# Patient Record
Sex: Female | Born: 1960 | Race: White | Hispanic: No | Marital: Married | Smoking: Current every day smoker
Health system: Southern US, Community
[De-identification: ages and names within clinical notes are randomized; demographics above are authoritative.]

## PROBLEM LIST (undated history)

## (undated) DIAGNOSIS — Z862 Personal history of diseases of the blood and blood-forming organs and certain disorders involving the immune mechanism: Secondary | ICD-10-CM

## (undated) DIAGNOSIS — J45909 Unspecified asthma, uncomplicated: Secondary | ICD-10-CM

## (undated) DIAGNOSIS — K219 Gastro-esophageal reflux disease without esophagitis: Secondary | ICD-10-CM

## (undated) HISTORY — PX: OTHER SURGICAL HISTORY: SHX169

## (undated) HISTORY — DX: Unspecified asthma, uncomplicated: J45.909

## (undated) HISTORY — DX: Gastro-esophageal reflux disease without esophagitis: K21.9

## (undated) HISTORY — DX: Personal history of diseases of the blood and blood-forming organs and certain disorders involving the immune mechanism: Z86.2

## (undated) HISTORY — PX: DILATION AND CURETTAGE OF UTERUS: SHX78

---

## 1998-07-30 ENCOUNTER — Encounter: Admission: RE | Admit: 1998-07-30 | Discharge: 1998-07-30 | Payer: Self-pay | Admitting: *Deleted

## 2012-03-01 ENCOUNTER — Other Ambulatory Visit: Payer: Self-pay | Admitting: Allergy and Immunology

## 2012-03-01 ENCOUNTER — Ambulatory Visit
Admission: RE | Admit: 2012-03-01 | Discharge: 2012-03-01 | Disposition: A | Discharge status: Home / Self Care | Payer: BC Managed Care – PPO | Source: Ambulatory Visit | Attending: Allergy and Immunology | Admitting: Allergy and Immunology

## 2012-03-01 DIAGNOSIS — R05 Cough: Secondary | ICD-10-CM

## 2012-04-06 ENCOUNTER — Other Ambulatory Visit: Payer: Self-pay | Admitting: Nurse Practitioner

## 2012-04-06 DIAGNOSIS — R102 Pelvic and perineal pain: Secondary | ICD-10-CM

## 2012-04-07 ENCOUNTER — Ambulatory Visit
Admission: RE | Admit: 2012-04-07 | Discharge: 2012-04-07 | Disposition: A | Discharge status: Home / Self Care | Payer: 59 | Source: Ambulatory Visit | Attending: Nurse Practitioner | Admitting: Nurse Practitioner

## 2012-04-07 DIAGNOSIS — R102 Pelvic and perineal pain: Secondary | ICD-10-CM

## 2013-05-11 ENCOUNTER — Encounter: Payer: Self-pay | Admitting: Gynecology

## 2013-05-11 ENCOUNTER — Ambulatory Visit (INDEPENDENT_AMBULATORY_CARE_PROVIDER_SITE_OTHER): Payer: 59 | Admitting: Gynecology

## 2013-05-11 ENCOUNTER — Encounter: Payer: Self-pay | Admitting: Certified Nurse Midwife

## 2013-05-11 ENCOUNTER — Telehealth: Payer: Self-pay | Admitting: Obstetrics & Gynecology

## 2013-05-11 VITALS — BP 140/70 | Wt 136.0 lb

## 2013-05-11 DIAGNOSIS — N949 Unspecified condition associated with female genital organs and menstrual cycle: Secondary | ICD-10-CM

## 2013-05-11 DIAGNOSIS — N938 Other specified abnormal uterine and vaginal bleeding: Secondary | ICD-10-CM

## 2013-05-11 NOTE — Telephone Encounter (Signed)
Patient having excessive bleeding and wants to be seen as soon as possible.

## 2013-05-11 NOTE — Progress Notes (Signed)
Subjective:     Patient ID: Gabrielle Norton, female   DOB: [birth year], 52 y.o.   MRN: [id number]  HPI Comments: Pt presents with DUB, pt is currently on progesterone from [facility].  She is using 1 click a day, prior dose 1 click/ until day 14 and was supossed to 2 clicks until menses.  Then in February concentration increased to 5% and 3 clicks for 2nd halve of cycle but never did the increase.  Now pt reports May 1 began bleeding on day 23, very light for 3d, and has continue to bleed brown, red or pink, painless.  Pt has not entered menopause.  Started hormones approx 1y ago.  Now reports feeling hot in face    Review of Systems Per HPI    Objective:   Physical Exam  Constitutional: She is oriented to person, place, and time. She appears well-developed and well-nourished.  Neurological: She is alert and oriented to person, place, and time.  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  Pt was started on progestin for menorrhagia but may have too high or too low dose at this point.  Suggest pt stop progestins and rto for u/s to evaluate lining, she is agreeable   TSH, CBC, upt ordered

## 2013-05-11 NOTE — Telephone Encounter (Signed)
Spoke with pt about her irregular bleeding. Now on day 22 of bleeding, which has varied from spotting to heavy at times. Pt has history of low ferritin level and irregular bleeding since last year. Pt on several supplements. Pt also on compounded progesterone. Pt bleeding now, not excessively. Pt estimates she changes her pad once a day. Pt tired a lot and is weary of irregular bleeding. Pt prefers to be seen today if possible. Scheduled OV with TL today at 1:45.

## 2013-05-12 ENCOUNTER — Telehealth: Payer: Self-pay | Admitting: *Deleted

## 2013-05-12 LAB — CBC
MCH: 29.5 pg (ref 26.0–34.0)
MCHC: 32.7 g/dL (ref 30.0–36.0)
MCV: 90.1 fL (ref 78.0–100.0)
Platelets: 416 10*3/uL — ABNORMAL HIGH (ref 150–400)
RDW: 13.9 % (ref 11.5–15.5)

## 2013-05-12 NOTE — Telephone Encounter (Signed)
Spoke with pt who saw TL yesterday. Was told to stop compounded progesterone to see if it will help her bleeding. Pt has been on it for a year and is nervous to stop it altogether. Reports it has helped her heavy bleeding and clots, and she just wants Dr. Rondel Baton opinion before she will feel OK about DC'ing. Pt fears her ferritin level will drop, and she gets panic attacks and "feels crazy" when ferritin gets low. Pt fine with having PUS as suggested by TL. Please advise.

## 2013-05-12 NOTE — Telephone Encounter (Signed)
Pt would like a second opinion from nurse on instructions from last appointment.

## 2013-05-12 NOTE — Telephone Encounter (Signed)
Left Message To Call Back  

## 2013-05-12 NOTE — Telephone Encounter (Signed)
Message copied by Lorraine Lax on Fri May 12, 2013  1:16 PM ------      Message from: Douglass Rivers      Created: Fri May 12, 2013  8:21 AM       Thyroid, cbc normal, ------

## 2013-05-12 NOTE — Addendum Note (Signed)
Addended by: Lorraine Lax on: 05/12/2013 01:16 PM   Modules accepted: Orders

## 2013-05-15 NOTE — Telephone Encounter (Signed)
Can you bring me this pt's chart on Mon so I can look at it when I get out of the OR?  Thanks.

## 2013-05-16 NOTE — Telephone Encounter (Signed)
Pt notified of labs (see Labs)

## 2013-05-16 NOTE — Telephone Encounter (Signed)
Please see my prior note.  I didn't route it to you, accidentally.

## 2013-05-16 NOTE — Telephone Encounter (Signed)
Please let patient know I reviewed the notes from Dr. Liliana Cline visit and I think her suggestion is worth trying.  She also needs to come in for the ultrasound.

## 2013-05-17 NOTE — Telephone Encounter (Signed)
LMTCB  aa 

## 2013-05-17 NOTE — Telephone Encounter (Signed)
Doubtful because it is topical.

## 2013-05-17 NOTE — Telephone Encounter (Signed)
Spoke with pt about SM advice. Pt reports she hasn't used the progesterone since Sunday night, and "so far, so good." Relayed that SM said stopping it was worth trying to see if bleeding will stabilize.  Instructed pt that we will be contacting her soon about the PUS.  Pt still wondering if there could be any negative effects from "stopping cold [place]," and would like SM's opinion.

## 2013-05-17 NOTE — Telephone Encounter (Signed)
Spoke to pt about SM advice. Pt will continue to not use progesterone and see how it goes. Pt will call back with any problems.

## 2013-05-18 ENCOUNTER — Ambulatory Visit: Payer: Self-pay | Admitting: Obstetrics & Gynecology

## 2013-05-23 ENCOUNTER — Other Ambulatory Visit: Payer: Self-pay | Admitting: Gynecology

## 2013-05-23 ENCOUNTER — Ambulatory Visit (INDEPENDENT_AMBULATORY_CARE_PROVIDER_SITE_OTHER): Payer: 59 | Admitting: Gynecology

## 2013-05-23 ENCOUNTER — Ambulatory Visit (INDEPENDENT_AMBULATORY_CARE_PROVIDER_SITE_OTHER): Payer: 59

## 2013-05-23 DIAGNOSIS — N949 Unspecified condition associated with female genital organs and menstrual cycle: Secondary | ICD-10-CM

## 2013-05-23 DIAGNOSIS — N938 Other specified abnormal uterine and vaginal bleeding: Secondary | ICD-10-CM

## 2013-05-23 DIAGNOSIS — R9389 Abnormal findings on diagnostic imaging of other specified body structures: Secondary | ICD-10-CM

## 2013-05-23 DIAGNOSIS — N852 Hypertrophy of uterus: Secondary | ICD-10-CM

## 2013-05-23 DIAGNOSIS — N920 Excessive and frequent menstruation with regular cycle: Secondary | ICD-10-CM

## 2013-05-23 DIAGNOSIS — N831 Corpus luteum cyst of ovary, unspecified side: Secondary | ICD-10-CM

## 2013-05-23 DIAGNOSIS — Z7189 Other specified counseling: Secondary | ICD-10-CM

## 2013-05-23 DIAGNOSIS — Z79890 Hormone replacement therapy (postmenopausal): Secondary | ICD-10-CM

## 2013-05-23 NOTE — Progress Notes (Signed)
Pt presents for u/s secondary to DUB related to exogenous hormones, pt was instructed to stop progestin and since had what she described as a cycle. U/S images were reviewed with pt in real time, the EMS appears cystic/solid is 19.96, left ovary has a cystic/solid mass with +CFD within which may represent an early hemorrhagic cyst. We compared the u/s from 2013, EM 5-solid.  Based on difference we suggested a sonohysterogram, risks and benefits were reviewed and accepted.  SHG: speculum placed, cervix cleansed with betadine, insemination catheter was advanced with ease, the walls were gently distended- no gross defects were noted, EMS 15mm, appeared fluffy. Recommended proceeding with EMB: consented pipelle advanced to fundus, sounded 11cm, 2 passes for moderate tissue on 2nd pass. Sent to pathology Tolerated both procedures well, will contact with results Pt has many questions regarding her supplemental hormones that she has been taking.  We discussed ACOG's opinion on bioidentical and compounded hormones, we discussed the dose to dose variabiolity that can be seen, we discussed potential imbalances between her natural hormones and the supplements. I suggested that she might benefit with a consult after the biopsy results come back to review all of her options and she is agreeable. Length of additional time spent discussed hormones and DUB 78m

## 2013-05-26 ENCOUNTER — Telehealth: Payer: Self-pay | Admitting: Obstetrics & Gynecology

## 2013-05-26 NOTE — Telephone Encounter (Signed)
Patient's husband calling for patient's endo biopsy result since patient is so anxious. Biopsy was just done on 05-23-13.  Results noted released into EPIC at 1404 but have not been seen by a provider. Advised husband no results available.  Dr Tresa Res, can I release anything to patient?

## 2013-05-26 NOTE — Telephone Encounter (Signed)
Patient is calling requesting results of sonogram. Please discuss with husband Renae Fickle.

## 2013-05-26 NOTE — Telephone Encounter (Signed)
Please tell pt the biopsy was benign, and had a polyp.  She should have a consult with Dr. Farrel Gobble to discuss the plan from here.

## 2013-05-26 NOTE — Telephone Encounter (Signed)
Call back to [facility], (release in paper chart) notified as Dr Tresa Res instructed.  After Dr Farrel Gobble reviews on Monday we can schedule consult.

## 2013-05-31 ENCOUNTER — Telehealth: Payer: Self-pay | Admitting: *Deleted

## 2013-05-31 NOTE — Telephone Encounter (Signed)
Patient returned call and briefly discussed path report and that Dr Farrel Gobble will want to review treatement options.  Patient  Does NOT want synthetic hormones.  She asked about ablation and IUD, very brief explanation and advised this is what Dr Farrel Gobble needs to review with the benefits and risks of each.  Appt scheduled for tomm 06-01-13.

## 2013-05-31 NOTE — Telephone Encounter (Signed)
Message copied by Alisa Graff on Wed May 31, 2013  9:43 AM ------      Message from: Douglass Rivers      Created: Mon May 29, 2013  8:33 AM       Left message in [place] for pt to call for consult to discuss hormones and bleeding, should be last appt of day ------

## 2013-05-31 NOTE — Telephone Encounter (Signed)
LMTCB, VM confirms correct number.  LM calling to follow up on message from her doctor.  Notify of path report and needs OV to discuss.

## 2013-05-31 NOTE — Telephone Encounter (Signed)
Message copied by Alisa Graff on Wed May 31, 2013  9:25 AM ------      Message from: Douglass Rivers      Created: Mon May 29, 2013  8:33 AM       Left message in [place] for pt to call for consult to discuss hormones and bleeding, should be last appt of day ------

## 2013-06-01 ENCOUNTER — Ambulatory Visit (INDEPENDENT_AMBULATORY_CARE_PROVIDER_SITE_OTHER): Payer: 59 | Admitting: Gynecology

## 2013-06-01 VITALS — BP 138/66 | HR 84 | Resp 18 | Ht 65.5 in | Wt 131.0 lb

## 2013-06-01 DIAGNOSIS — N949 Unspecified condition associated with female genital organs and menstrual cycle: Secondary | ICD-10-CM

## 2013-06-01 DIAGNOSIS — N920 Excessive and frequent menstruation with regular cycle: Secondary | ICD-10-CM

## 2013-06-01 DIAGNOSIS — N938 Other specified abnormal uterine and vaginal bleeding: Secondary | ICD-10-CM

## 2013-06-01 NOTE — Progress Notes (Signed)
Pt here with husband to discuss follow up treatment for her dysfunctional bleeding. Pt reports that she had stopped the exogenous progestin during the heavy bleeding and had not restarted them.  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  We reviewed both her recent [facility] and the images of her prior one.  Endometrial polyp not well defined. We discussed several options with them- First, D&C hysteroscopy to remove the polyp and liniing, they are aware that the polyp can contribute to the bleeding and has a risk of becoming cancer.  She can elect for an ablation or placement of an IUD at the time of surgery both of which should control her bleeding into menopause.  We discussed the risks and benefits of both as well as the risk of surgery and the expected post-op course for both.  Bleeding, infection, uterine perforation was reviewed. She can watch her bleeding without the progestin and monitor forward how she does. We discussed the impact of dyssynchrony on BTB and the option of ACOG and FDA on compounded hormones. All questions were addressed. She was given information on Mirena IUD, Hysteroscopy and ablation. They were asked to call the office with their decision. Length 28m >50% face to face discussing menorrhagia and treatment

## 2013-06-01 NOTE — Patient Instructions (Addendum)
Endometrial Ablation Endometrial ablation removes the lining of the uterus (endometrium). It is usually a same day, outpatient treatment. Ablation helps avoid major surgery (such as a hysterectomy). A hysterectomy is removal of the cervix and uterus. Endometrial ablation has less risk and complications, has a shorter recovery period and is less expensive. After endometrial ablation, most women will have little or no menstrual bleeding. You may not keep your fertility. Pregnancy is no longer likely after this procedure but if you are pre-menopausal, you still need to use a reliable method of birth control following the procedure because pregnancy can occur. REASONS TO HAVE THE PROCEDURE MAY INCLUDE:  Heavy periods.  Bleeding that is causing anemia.  Anovulatory bleeding, very irregular, bleeding.  Bleeding submucous fibroids (on the lining inside the uterus) if they are smaller than 3 centimeters. REASONS NOT TO HAVE THE PROCEDURE MAY INCLUDE:  You wish to have more children.  You have a pre-cancerous or cancerous problem. The cause of any abnormal bleeding must be diagnosed before having the procedure.  You have pain coming from the uterus.  You have a submucus fibroid larger than 3 centimeters.  You recently had a baby.  You recently had an infection in the uterus.  You have a severe retro-flexed, tipped uterus and cannot insert the instrument to do the ablation.  You had a Cesarean section or deep major surgery on the uterus.  The inner cavity of the uterus is too large for the endometrial ablation instrument. RISKS AND COMPLICATIONS   Perforation of the uterus.  Bleeding.  Infection of the uterus, bladder or vagina.  Injury to surrounding organs.  Cutting the cervix.  An air bubble to the lung (air embolus).  Pregnancy following the procedure.  Failure of the procedure to help the problem requiring hysterectomy.  Decreased ability to diagnose cancer in the lining of  the uterus. BEFORE THE PROCEDURE  The lining of the uterus must be tested to make sure there is no pre-cancerous or cancer cells present.  Medications may be given to make the lining of the uterus thinner.  Ultrasound may be used to evaluate the size and look for abnormalities of the uterus.  Future pregnancy is not desired. PROCEDURE  There are different ways to destroy the lining of the uterus.   Resectoscope - radio frequency-alternating electric current is the most common one used.  Cryotherapy - freezing the lining of the uterus.  Heated Free Liquid - heated salt (saline) solution inserted into the uterus.  Microwave - uses high energy microwaves in the uterus.  Thermal Balloon - a catheter with a balloon tip is inserted into the uterus and filled with heated fluid. Your caregiver will talk with you about the method used in this clinic. They will also instruct you on the pros and cons of the procedure. Endometrial ablation is performed along with a procedure called operative hysteroscopy. A narrow viewing tube is inserted through the birth canal (vagina) and through the cervix into the uterus. A tiny camera attached to the viewing tube (hysteroscope) allows the uterine cavity to be shown on a TV monitor during surgery. Your uterus is filled with a harmless liquid to make the procedure easier. The lining of the uterus is then removed. The lining can also be removed with a resectoscope which allows your surgeon to cut away the lining of the uterus under direct vision. Usually, you will be able to go home within an hour after the procedure. HOME CARE INSTRUCTIONS   Do   not drive for 24 hours.  No tampons, douching or intercourse for 2 weeks or until your caregiver approves.  Rest at home for 24 to 48 hours. You may then resume normal activities unless told differently by your caregiver.  Take your temperature two times a day for 4 days, and record it.  Take any medications your  caregiver has ordered, as directed.  Use some form of contraception if you are pre-menopausal and do not want to get pregnant. Bleeding after the procedure is normal. It varies from light spotting and mildly watery to bloody discharge for 4 to 6 weeks. You may also have mild cramping. Only take over-the-counter or prescription medicines for pain, discomfort, or fever as directed by your caregiver. Do not use aspirin, as this may aggravate bleeding. Frequent urination during the first 24 hours is normal. You will not know how effective your surgery is until at least 3 months after the surgery. SEEK IMMEDIATE MEDICAL CARE IF:   Bleeding is heavier than a normal menstrual cycle.  An oral temperature above 102 F (38.9 C) develops.  You have increasing cramps or pains not relieved with medication or develop belly (abdominal) pain which does not seem to be related to the same area of earlier cramping and pain.  You are light headed, weak or have fainting episodes.  You develop pain in the shoulder strap areas.  You have chest or leg pain.  You have abnormal vaginal discharge.  You have painful urination. Document Released: 10/16/2004 Document Revised: 02/29/2012 Document Reviewed: 01/14/2008 [facility] Patient Information 2014 [place], [place]. Hysteroscopy Hysteroscopy is a procedure used for looking inside the womb (uterus). It may be done for many different reasons, including:  To evaluate abnormal bleeding, fibroid (benign, noncancerous) tumors, polyps, scar tissue (adhesions), and possibly cancer of the uterus.  To look for lumps (tumors) and other uterine growths.  To look for causes of why a woman cannot get pregnant (infertility), causes of recurrent loss of pregnancy (miscarriages), or a lost intrauterine device (IUD).  To perform a sterilization by blocking the fallopian tubes from inside the uterus. A hysteroscopy should be done right after a menstrual period to be sure you are  not pregnant. LET YOUR CAREGIVER KNOW ABOUT:   Allergies.  Medicines taken, including herbs, eyedrops, over-the-counter medicines, and creams.  Use of steroids (by mouth or creams).  Previous problems with anesthetics or numbing medicines.  History of bleeding or blood problems.  History of blood clots.  Possibility of pregnancy, if this applies.  Previous surgery.  Other health problems. RISKS AND COMPLICATIONS   Putting a hole in the uterus.  Excessive bleeding.  Infection.  Damage to the cervix.  Injury to other organs.  Allergic reaction to medicines.  Too much fluid used in the uterus for the procedure. BEFORE THE PROCEDURE   Do not take aspirin or blood thinners for a week before the procedure, or as directed. It can cause bleeding.  Arrive at least 60 minutes before the procedure or as directed to read and sign the necessary forms.  Arrange for someone to take you home after the procedure.  If you smoke, do not smoke for 2 weeks before the procedure. PROCEDURE   Your caregiver may give you medicine to relax you. He or she may also give you a medicine that numbs the area around the cervix (local anesthetic) or a medicine that makes you sleep (general anesthesia).  Sometimes, a medicine is placed in the cervix the day before the  procedure. This medicine makes the cervix have a larger opening (dilate). This makes it easier for the instrument to be inserted into the uterus.  A small instrument (hysteroscope) is inserted through the vagina into the uterus. This instrument is similar to a pencil-sized telescope with a light.  During the procedure, air or a liquid is put into the uterus, which allows the surgeon to see better.  Sometimes, tissue is gently scraped from inside the uterus. These tissue samples are sent to a specialist who looks at tissue samples (pathologist). The pathologist will give a report to your caregiver. This will help your caregiver decide  if further treatment is necessary. The report will also help your caregiver decide on the best treatment if the test comes back abnormal. AFTER THE PROCEDURE   If you had a general anesthetic, you may be groggy for a couple hours after the procedure.  If you had a local anesthetic, you will be advised to rest at the surgical center or caregiver's office until you are stable and feel ready to go home.  You may have some cramping for a couple days.  You may have bleeding, which varies from light spotting for a few days to menstrual-like bleeding for up to 3 to 7 days. This is normal.  Have someone take you home. FINDING OUT THE RESULTS OF YOUR TEST Not all test results are available during your visit. If your test results are not back during the visit, make an appointment with your caregiver to find out the results. Do not assume everything is normal if you have not heard from your caregiver or the medical facility. It is important for you to follow up on all of your test results. HOME CARE INSTRUCTIONS   Do not drive for 24 hours or as instructed.  Only take over-the-counter or prescription medicines for pain, discomfort, or fever as directed by your caregiver.  Do not take aspirin. It can cause or aggravate bleeding.  Do not drive or drink alcohol while taking pain medicine.  You may resume your usual diet.  Do not use tampons, douche, or have sexual intercourse for 2 weeks, or as advised by your caregiver.  Rest and sleep for the first 24 to 48 hours.  Take your temperature twice a day for 4 to 5 days. Write it down. Give these temperatures to your caregiver if they are abnormal (above 98.6 F or 37.0 C).  Take medicines your caregiver has ordered as directed.  Follow your caregiver's advice regarding diet, exercise, lifting, driving, and general activities.  Take showers instead of baths for 2 weeks, or as recommended by your caregiver.  If you develop constipation:  Take a  mild laxative with the advice of your caregiver.  Eat bran foods.  Drink enough water and fluids to keep your urine clear or pale yellow.  Try to have someone with you or available to you for the first 24 to 48 hours, especially if you had a general anesthetic.  Make sure you and your family understand everything about your operation and recovery.  Follow your caregiver's advice regarding follow-up appointments and Pap smears. SEEK MEDICAL CARE IF:   You feel dizzy or lightheaded.  You feel sick to your stomach (nauseous).  You develop abnormal vaginal discharge.  You develop a rash.  You have an abnormal reaction or allergy to your medicine.  You need stronger pain medicine. SEEK IMMEDIATE MEDICAL CARE IF:   Bleeding is heavier than a normal menstrual period  or you have blood clots.  You have an oral temperature above 102 F (38.9 C), not controlled by medicine.  You have increasing cramps or pains not relieved with medicine.  You develop belly (abdominal) pain that does not seem to be related to the same area of earlier cramping and pain.  You pass out.  You develop pain in the tops of your shoulders (shoulder strap areas).  You develop shortness of breath. MAKE SURE YOU:   Understand these instructions.  Will watch your condition.  Will get help right away if you are not doing well or get worse. Document Released: 03/15/2001 Document Revised: 02/29/2012 Document Reviewed: 07/08/2009 [facility] Patient Information 2014 [place], [place]. Intrauterine Device Information An intrauterine device (IUD) is inserted into your uterus and prevents pregnancy. There are 2 types of IUDs available:  Copper IUD. This type of IUD is wrapped in copper wire and is placed inside the uterus. Copper makes the uterus and fallopian tubes produce a fluid that kills sperm. The copper IUD can stay in place for 10 years.  Hormone IUD. This type of IUD contains the hormone progestin  (synthetic progesterone). The hormone thickens the cervical mucus and prevents sperm from entering the uterus, and it also thins the uterine lining to prevent implantation of a fertilized egg. The hormone can weaken or kill the sperm that get into the uterus. The hormone IUD can stay in place for 5 years. Your caregiver will make sure you are a good candidate for a contraceptive IUD. Discuss with your caregiver the possible side effects. ADVANTAGES  It is highly effective, reversible, long-acting, and low maintenance.  There are no estrogen-related side effects.  An IUD can be used when breastfeeding.  It is not associated with weight gain.  It works immediately after insertion.  The copper IUD does not interfere with your female hormones.  The progesterone IUD can make heavy menstrual periods lighter.  The progesterone IUD can be used for 5 years.  The copper IUD can be used for 10 years. DISADVANTAGES  The progesterone IUD can be associated with irregular bleeding patterns.  The copper IUD can make your menstrual flow heavier and more painful.  You may experience cramping and vaginal bleeding after insertion. Document Released: 11/10/2004 Document Revised: 02/29/2012 Document Reviewed: 04/11/2011 [facility] Patient Information 2014 [place], [place].

## 2013-06-06 ENCOUNTER — Encounter: Payer: Self-pay | Admitting: Gynecology

## 2013-06-06 ENCOUNTER — Ambulatory Visit (INDEPENDENT_AMBULATORY_CARE_PROVIDER_SITE_OTHER): Payer: 59 | Admitting: Gynecology

## 2013-06-06 VITALS — BP 120/78 | Ht 66.0 in | Wt 128.0 lb

## 2013-06-06 DIAGNOSIS — N926 Irregular menstruation, unspecified: Secondary | ICD-10-CM

## 2013-06-06 NOTE — Patient Instructions (Signed)
Office will call you with lab results. Followup for sonohysterogram as scheduled.

## 2013-06-06 NOTE — Progress Notes (Signed)
Gabrielle Norton [birth year] [id number]        52 y.o.  [id number] for a new patient with a history of heavier menses this past year getting closer together coming every 21-25 days. Had been started on progesterone cream daily a year ago. Saw Dr. Farrel Gobble in followup when she started to have more erratic bleeding with intermenstrual bleeding. Had a normal CBC and TSH. A negative hCG. Sonohysterogram questionable polyp although not clearly defined on review of the pictures. Endometrial sample showed dyssynchronous endometrium. Patient wanted second opinion. Currently finishing a menstrual cycle lasting for the last 5-7 days. Using coitus interruptus for contraception. Currently smokes daily.   Past medical history,surgical history, medications, allergies, family history and social history were all reviewed and documented in the EPIC chart.  ROS:  Performed and pertinent positives and negatives are included in the history, assessment and plan .  Exam: Kim assistant Filed Vitals:   06/06/13 1001  BP: 120/78  Height: 5\' 6"  (1.676 m)  Weight: 128 lb (58.06 kg)   General appearance  Normal Abdominal  soft, nontender, without masses, organomegaly or hernia Pelvic  Ext/BUS/vagina  normal with menses flow.  Cervix  normal with multiparous changes  Uterus  anteverted, normal size, shape and contour, midline and mobile nontender   Adnexa  Without masses or tenderness    Anus and perineum  normal       Assessment/Plan:  52 y.o. [id number] female    1. Irregular bleeding. Menses have been heavier over the past year had been using progesterone cream daily and has discontinued this. Menses also getting closer together every 21-25 days. With some intermenstrual bleeding. Sonohysterogram showed some question of polyp although not clearly demonstrated. Endometrial sample showed dyssynchronous endometrium. Options for management were reviewed to include observation, hysteroscopy D&C to rule out polyp, repeat  sonohysterogram. I think her more frequent menses are perimenopausal/age related. The heavier menses seem to be going on longer. Options for management to include hormonal manipulation, Mirena IUD, endometrial ablation discussed. Certainly before proceeding with endometrial ablation I would want to rule out the polyp with repeat sonohysterogram. The issues of bioidentical hormone use and less well proven benefits discussed. The patient has already read about this and made up her mind to stop it. She will followup for a repeat sonohysterogram I also wanted to recheck a CBC and an FSH to see where we stand from a perimenopausal standpoint.  Addendum: Subsequently received pathology report from endometrial biopsy done by Dr. Farrel Gobble which showed: ENDOMETRIUM, BIOPSY: -DYSSYNCHRONOUS ENDOMETRIUM AND PORTIONS OF ENDOMETRIAL POLYP -NEGATIVE FOR ATYPIA OR CARCINOMA Will further discuss with patient at followup ultrasound appointment.     Dara Lords MD, 12:11 PM 06/06/2013

## 2013-06-08 ENCOUNTER — Ambulatory Visit: Payer: 59 | Admitting: Obstetrics & Gynecology

## 2013-06-09 ENCOUNTER — Other Ambulatory Visit: Payer: Self-pay | Admitting: Gynecology

## 2013-06-09 DIAGNOSIS — N949 Unspecified condition associated with female genital organs and menstrual cycle: Secondary | ICD-10-CM

## 2013-06-12 ENCOUNTER — Ambulatory Visit (INDEPENDENT_AMBULATORY_CARE_PROVIDER_SITE_OTHER): Payer: 59

## 2013-06-12 ENCOUNTER — Encounter: Payer: Self-pay | Admitting: Gynecology

## 2013-06-12 ENCOUNTER — Ambulatory Visit (INDEPENDENT_AMBULATORY_CARE_PROVIDER_SITE_OTHER): Payer: 59 | Admitting: Gynecology

## 2013-06-12 DIAGNOSIS — N839 Noninflammatory disorder of ovary, fallopian tube and broad ligament, unspecified: Secondary | ICD-10-CM

## 2013-06-12 DIAGNOSIS — N840 Polyp of corpus uteri: Secondary | ICD-10-CM

## 2013-06-12 DIAGNOSIS — N949 Unspecified condition associated with female genital organs and menstrual cycle: Secondary | ICD-10-CM

## 2013-06-12 DIAGNOSIS — N926 Irregular menstruation, unspecified: Secondary | ICD-10-CM

## 2013-06-12 DIAGNOSIS — N938 Other specified abnormal uterine and vaginal bleeding: Secondary | ICD-10-CM

## 2013-06-12 NOTE — Patient Instructions (Signed)
Office will call you with the biopsy results. We will then decide course of action.

## 2013-06-12 NOTE — Progress Notes (Signed)
Patient presents for a sonohysterogram with history as outlined 06/06/2013 noted.  Ultrasound shows uterus overall normal to generous in size. Homogeneous echotexture. Endometrial echo 13.5 mm. Right and left ovaries grossly normal.  Procedure: Sonohysterogram performed, sterile technique, easy catheter introduction, good distention with no abnormalities.  Assessment and plan: Irregular more frequent menses. Recent hemoglobin 13.5, FSH 21 TSH normal. Feel most of her bleeding is age related. Biopsy by Dr. Farrel Gobble did show desynchronous endometrium with fragments of benign endometrial polyp. No evidence of endometrial polyp on sonohysterogram today. Patient will follow up her biopsy results. Various scenarios to include observation, hormonal manipulation, hysteroscopy, endometrial ablation, Mirena IUD were reviewed. Patient is leaning towards Mirena IUD. Certainly would address both contraception and hopefully menstrual regulation. Patient will follow up for biopsy results and then we'll go from there.

## 2013-06-14 ENCOUNTER — Other Ambulatory Visit: Payer: Self-pay | Admitting: Gynecology

## 2013-06-14 ENCOUNTER — Telehealth: Payer: Self-pay | Admitting: Gynecology

## 2013-06-14 DIAGNOSIS — Z3049 Encounter for surveillance of other contraceptives: Secondary | ICD-10-CM

## 2013-06-14 MED ORDER — LEVONORGESTREL 20 MCG/24HR IU IUD: INTRAUTERINE_SYSTEM | Freq: Once | INTRAUTERINE | Status: AC

## 2013-06-14 NOTE — Telephone Encounter (Signed)
06/14/13-Pt was advised today that her [employer] ins covers the Mirena at 100%,no copay. She is coming in 06/15/13 for insertion/wl

## 2013-06-15 ENCOUNTER — Encounter: Payer: Self-pay | Admitting: Gynecology

## 2013-06-15 ENCOUNTER — Ambulatory Visit (INDEPENDENT_AMBULATORY_CARE_PROVIDER_SITE_OTHER): Payer: 59 | Admitting: Gynecology

## 2013-06-15 DIAGNOSIS — Z30431 Encounter for routine checking of intrauterine contraceptive device: Secondary | ICD-10-CM

## 2013-06-15 NOTE — Patient Instructions (Signed)
Intrauterine Device Insertion Most often, an intrauterine device (IUD) is inserted into the uterus to prevent pregnancy. There are 2 types of IUDs available:  Copper IUD. This type of IUD creates an environment that is not favorable to sperm survival. The mechanism of action of the copper IUD is not known for certain. It can stay in place for 10 years.  Hormone IUD. This type of IUD contains the hormone progestin (synthetic progesterone). The progestin thickens the cervical mucus and prevents sperm from entering the uterus, and it also thins the uterine lining. There is no evidence that the hormone IUD prevents implantation. The hormone IUD can stay in place for up to 5 years. An IUD is the most cost-effective birth control if left in place for the full duration. It may be removed at any time. LET YOUR CAREGIVER KNOW ABOUT:  Sensitivity to metals.  Medicines taken including herbs, eyedrops, over-the-counter medicines, and creams.  Use of steroids (by mouth or creams).  Previous problems with anesthetics or numbing medicine.  Previous gynecological surgery.  History of blood clots or clotting disorders.  Possibility of pregnancy.  Menstrual irregularities.  Concerns regarding unusual vaginal discharge or odors.  Previous experience with an IUD.  Other health problems. RISKS AND COMPLICATIONS  Accidental puncture (perforation) of the uterus.  Accidental placement of the IUD either in the muscle layer of the uterus (myometrium) or outside the uterus. If this happen, the IUD can be found essentially floating around the bowels. When this happens, the IUD must be taken out surgically.  The IUD may fall out of the uterus (expulsion). This is more common in women who have recently had a child.   Pregnancy in the fallopian tube (ectopic). BEFORE THE PROCEDURE  Schedule the IUD insertion for when you will have your menstrual period or right after, to make sure you are not pregnant.  Placement of the IUD is better tolerated shortly after a menstrual cycle.  You may need to take tests or be examined to make sure you are not pregnant.  You may be required to take a pregnancy test.  You may be required to get checked for sexually transmitted infections (STIs) prior to placement. Placing an IUD in someone who has an infection can make an infection worse.  You may be given a pain reliever to take 1 or 2 hours before the procedure.  An exam will be performed to determine the size and position of your uterus.  Ask your caregiver about changing or stopping your regular medicines. PROCEDURE   A tool (speculum) is placed in the vagina. This allows your caregiver to see the lower part of the uterus (cervix).  The cervix is prepped with a medicine that lowers the risk of infection.  You may be given a medicine to numb each side of the cervix (intracervical or paracervical block). This is used to block and control any discomfort with insertion.  A tool (uterine sound) is inserted into the uterus to determine the length of the uterine cavity and the direction the uterus may be tilted.  A slim instrument (IUD inserter) is inserted through the cervical canal and into your uterus.  The IUD is placed in the uterine cavity and the insertion device is removed.  The nylon string that is attached to the IUD, and used for eventual IUD removal, is trimmed. It is trimmed so that it lays high in the vagina, just outside the cervix. AFTER THE PROCEDURE  You may have bleeding after the   procedure. This is normal. It varies from light spotting for a few days to menstrual-like bleeding.  You may have mild cramping.  Practice checking the string coming out of the cervix to make sure the IUD remains in the uterus. If you cannot feel the string, you should schedule a "string check" with your caregiver.  If you had a hormone IUD inserted, expect that your period may be lighter or nonexistent  within a year's time (though this is not always the case). There may be delayed fertility with the hormone IUD as a result of its progesterone effect. When you are ready to become pregnant, it is suggested to have the IUD removed up to 1 year in advance.  Yearly exams are advised. Document Released: 08/05/2011 Document Revised: 02/29/2012 Document Reviewed: 08/05/2011 [employer] Patient Information 2014 [employer].  

## 2013-06-15 NOTE — Progress Notes (Signed)
Patient presents for Mirena IUD placement. She has read through the booklet, has no contraindications and signed the consent form.  I reviewed the insertional process with her as well as the risks to include infection either immediate or long-term, uterine perforation or migration requiring surgery to remove, other complications such as pain, hormonal side effects and possibilities of failure with subsequent pregnancy.   Exam with Kim assistant Pelvic: External BUS vagina normal. Cervix normal . Uterus anteverted normal size shape contour midline mobile nontender. Adnexa without masses or tenderness.  Procedure: The cervix was cleansed with Betadine, anterior lip grasped with a single-tooth tenaculum, the uterus was sounded and a Mirena IUD was placed according to manufacturer's recommendations without difficulty. The strings were trimmed. The patient tolerated well and will follow up in one month for a postinsertional check.  Lot number:  [id number]

## 2013-06-16 ENCOUNTER — Telehealth: Payer: Self-pay | Admitting: *Deleted

## 2013-06-16 NOTE — Telephone Encounter (Signed)
Pt has IUD placement yesterday in office, called today asking if normal to have some cramping. I informed pt this is normal and she may experience spotting as well. Pt okay with this and will call if further questions.

## 2013-06-30 ENCOUNTER — Other Ambulatory Visit: Payer: 59

## 2013-06-30 ENCOUNTER — Ambulatory Visit: Payer: 59 | Admitting: Gynecology

## 2013-07-10 ENCOUNTER — Ambulatory Visit (INDEPENDENT_AMBULATORY_CARE_PROVIDER_SITE_OTHER): Payer: 59 | Admitting: Gynecology

## 2013-07-10 ENCOUNTER — Encounter: Payer: Self-pay | Admitting: Gynecology

## 2013-07-10 DIAGNOSIS — Z30431 Encounter for routine checking of intrauterine contraceptive device: Secondary | ICD-10-CM

## 2013-07-10 DIAGNOSIS — N926 Irregular menstruation, unspecified: Secondary | ICD-10-CM

## 2013-07-10 NOTE — Progress Notes (Signed)
Patient presents for followup IUD check. Had Mirena IUD placed one month ago and has done well although has spotting on and off since. No pain or other issues. Thinks that she had a period in the interim.  Exam was Gabrielle Norton Abdomen soft nontender without masses guarding rebound organomegaly. Pelvic external BUS vagina normal. Cervix normal. IUD string visualized an appropriate length. Uterus normal size midline mobile nontender. Adnexa without masses or tenderness.  Assessment and plan: Normal IUD check. Monitor spotting. As long as it resolves  we'll follow expectantly. If continues she will call. Followup when she is due for her annual exam.

## 2013-07-10 NOTE — Patient Instructions (Signed)
Followup when you're due for your annual exam. Sooner if any issues.

## 2013-09-29 ENCOUNTER — Encounter: Payer: 59 | Admitting: Gynecology

## 2013-10-07 IMAGING — CR DG CHEST 2V
2 series · 2 of 2 positions shown · non-contrast
Comparison: None.

CLINICAL DATA: Cough, congestion, fever

CHEST - 2 VIEW

[w chest pa]
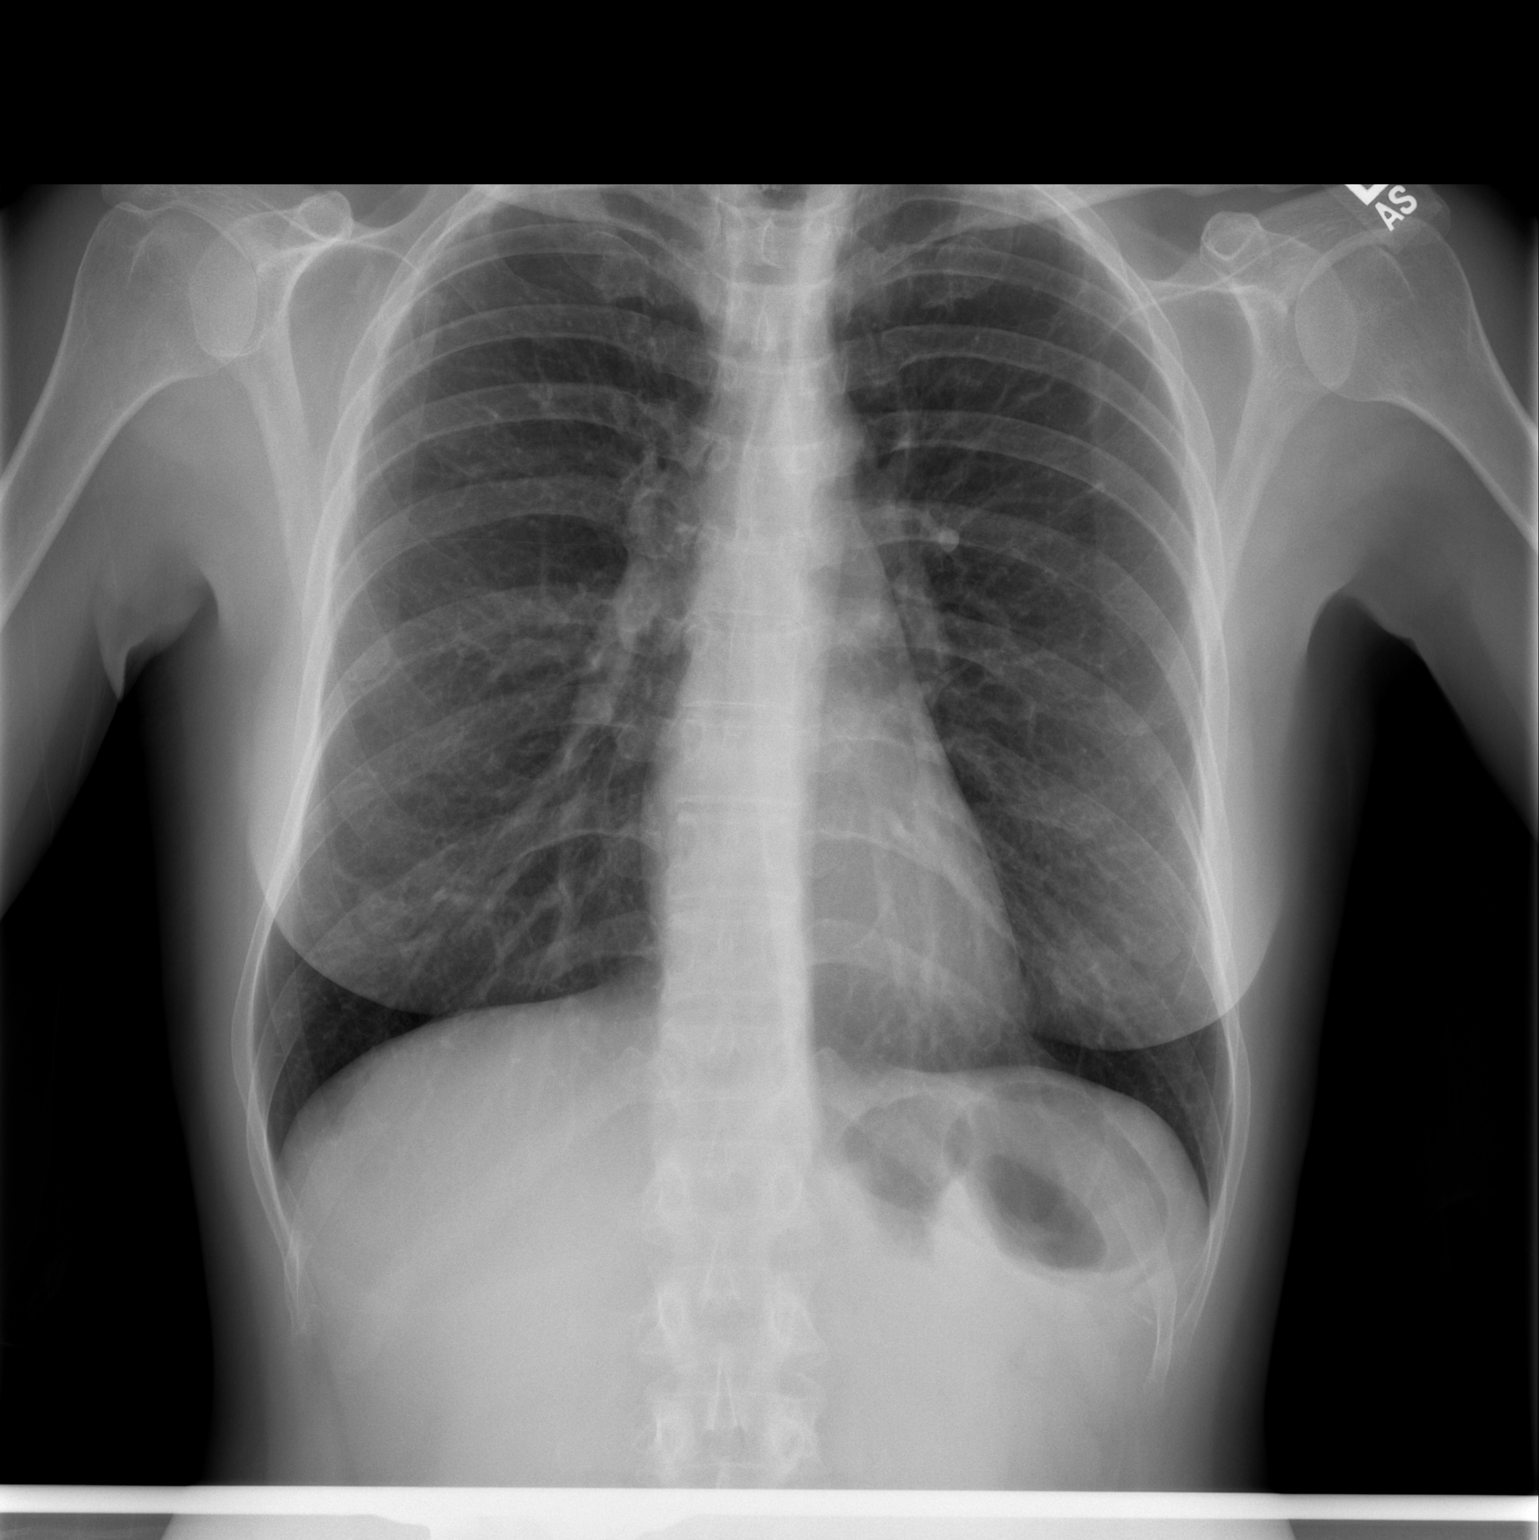

[w chest lat]
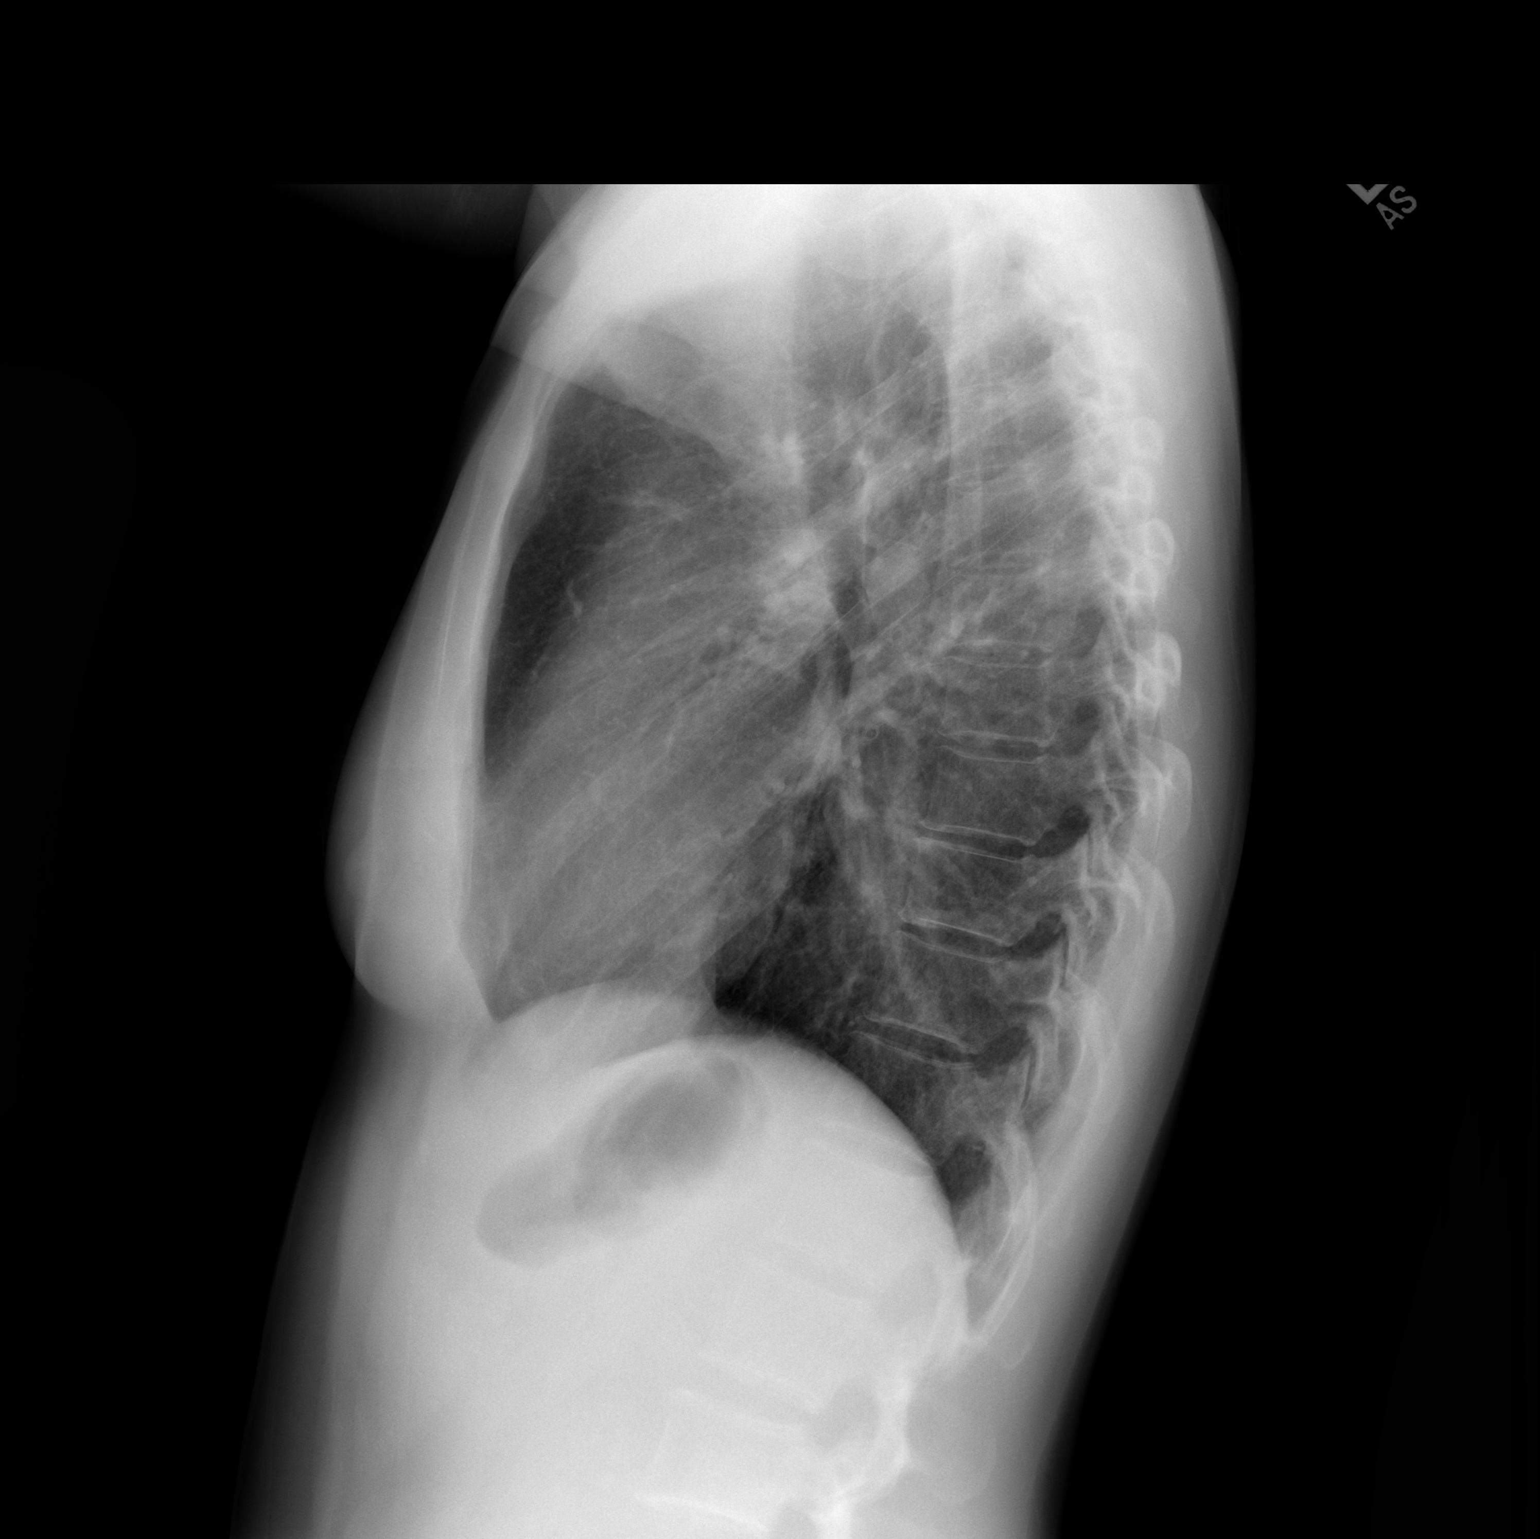

[2 of 2 positions shown; findings below may reference images not displayed]

FINDINGS: Normal heart size and vascularity.  Symmetric lower chest
nipple shadows noted.  Negative for pneumonia, collapse,
consolidation, edema, effusion or pneumothorax.  Trachea midline.
IMPRESSION: No acute chest finding.

## 2013-10-26 ENCOUNTER — Other Ambulatory Visit: Payer: Self-pay

## 2014-01-05 ENCOUNTER — Ambulatory Visit (INDEPENDENT_AMBULATORY_CARE_PROVIDER_SITE_OTHER): Payer: 59 | Admitting: Family Medicine

## 2014-01-05 VITALS — BP 160/100 | HR 115 | Temp 99.1°F | Resp 18 | Ht 66.0 in | Wt 130.0 lb

## 2014-01-05 DIAGNOSIS — J3489 Other specified disorders of nose and nasal sinuses: Secondary | ICD-10-CM

## 2014-01-05 DIAGNOSIS — R059 Cough, unspecified: Secondary | ICD-10-CM

## 2014-01-05 DIAGNOSIS — H1010 Acute atopic conjunctivitis, unspecified eye: Secondary | ICD-10-CM

## 2014-01-05 DIAGNOSIS — R05 Cough: Secondary | ICD-10-CM

## 2014-01-05 DIAGNOSIS — J09X2 Influenza due to identified novel influenza A virus with other respiratory manifestations: Secondary | ICD-10-CM

## 2014-01-05 LAB — POCT RAPID STREP A (OFFICE): Rapid Strep A Screen: NEGATIVE

## 2014-01-05 LAB — POCT INFLUENZA A/B
INFLUENZA B, POC: NEGATIVE
Influenza A, POC: POSITIVE

## 2014-01-05 LAB — POCT CBC
GRANULOCYTE PERCENT: 82.4 % — AB (ref 37–80)
HEMATOCRIT: 50.6 % — AB (ref 37.7–47.9)
Hemoglobin: 15.9 g/dL (ref 12.2–16.2)
LYMPH, POC: 1.2 (ref 0.6–3.4)
MCH: 30.9 pg (ref 27–31.2)
MCHC: 31.4 g/dL — AB (ref 31.8–35.4)
MCV: 98.5 fL — AB (ref 80–97)
MID (cbc): 0.8 (ref 0–0.9)
MPV: 7.8 fL (ref 0–99.8)
POC Granulocyte: 9.4 — AB (ref 2–6.9)
POC LYMPH %: 10.6 % (ref 10–50)
POC MID %: 7 %M (ref 0–12)
Platelet Count, POC: 294 10*3/uL (ref 142–424)
RBC: 5.14 M/uL (ref 4.04–5.48)
RDW, POC: 13.6 %
WBC: 11.4 10*3/uL — AB (ref 4.6–10.2)

## 2014-01-05 MED ORDER — OLOPATADINE HCL 0.2 % OP SOLN: OPHTHALMIC | Status: DC

## 2014-01-05 MED ORDER — OSELTAMIVIR PHOSPHATE 75 MG PO CAPS: 75.0000 mg | ORAL_CAPSULE | Freq: Two times a day (BID) | ORAL | Status: DC

## 2014-01-05 MED ORDER — IPRATROPIUM BROMIDE 0.03 % NA SOLN: 2.0000 | Freq: Four times a day (QID) | NASAL | Status: DC

## 2014-01-05 MED ORDER — HYDROCODONE-HOMATROPINE 5-1.5 MG/5ML PO SYRP: 5.0000 mL | ORAL_SOLUTION | Freq: Three times a day (TID) | ORAL | Status: DC | PRN

## 2014-01-05 NOTE — Patient Instructions (Signed)
You do have the flu- this is likely why you are feeling so bad.  Rest and drink plenty of fluids.  You can use the cough syrup as needed; remember it can make you drowsy so do not use it when you need to drive.  Let me know if you are not feeling better in the next few days- Sooner if worse.   We would like your pulse to be less than 100 and your blood pressure less than 135/85; if this is not the case please let me know.

## 2014-01-05 NOTE — Progress Notes (Signed)
[facility] [address], [place] [id number] [phone]  Date:  01/05/2014   Name:  Gabrielle Norton   DOB:  05-14-1961   MRN:  [id number]  PCP:  No PCP Per Patient    Chief Complaint: Sinus Problem   History of Present Illness:  Gabrielle Norton is a 53 y.o. very pleasant female patient who presents with the following:  Here today to follow-up illness. She started to feel ill just before Christmas. She went to another UC 10 days ago and was given Augmentin and a 5 day course of prednisone for presumed sinus infection. She felt that she was getting better- however she then took a turn for the worse again about 5 days ago.  She is just finishing up the augmentin rx.  She did not notice any fever at home, but has not checked her temp No GI symptoms Minimal cough Her main concern is that she feels bad in general- she has body aches, fatigue, and no energy.  She also notes that her nose is draining a lot down the back of her throat and really bothering her.   She has an IUD due to menorrhagia  Patient Active Problem List   Diagnosis Date Noted  . DUB (dysfunctional uterine bleeding) 05/24/2013  . Menorrhagia 05/24/2013  . Anemia     Past Medical History  Diagnosis Date  . Acid reflux disease with ulcer   . History of anemia   . Asthma     Past Surgical History  Procedure Laterality Date  . Dilation and curettage of uterus    . Mirena      Inserted 06-15-13    History  Substance Use Topics  . Smoking status: Current Every Day Smoker -- 0.50 packs/day    Types: Cigarettes  . Smokeless tobacco: Not on file  . Alcohol Use: 1.0 oz/week    2 drink(s) per week    Family History  Problem Relation Age of Onset  . Depression Father   . Diabetes Father     Allergies  Allergen Reactions  . Erythromycin Other (See Comments)    Cramping   . Xylocaine [Lidocaine Hcl] Rash    Medication list has been reviewed and updated.  Current Outpatient  Prescriptions on File Prior to Visit  Medication Sig Dispense Refill  . Ascorbic Acid (VITAMIN C PO) Take by mouth.      . beclomethasone (QVAR) 80 MCG/ACT inhaler Inhale 2 puffs into the lungs 2 (two) times daily.       . Cetirizine HCl (ZYRTEC PO) Take by mouth.      . IRON PO Take by mouth.      . levalbuterol (XOPENEX HFA) 45 MCG/ACT inhaler Inhale 1-2 puffs into the lungs every 4 (four) hours as needed for wheezing.      . Cholecalciferol (VITAMIN D PO) Take by mouth.      . Olopatadine HCl (PATADAY OP) Apply to eye.       Current Facility-Administered Medications on File Prior to Visit  Medication Dose Route Frequency Provider Last Rate Last Dose  . levonorgestrel (MIRENA) 20 MCG/24HR IUD   Intrauterine Once Dara Lordsimothy P Fontaine, MD        Review of Systems:  As per HPI- otherwise negative.   Physical Examination: Filed Vitals:   01/05/14 1337  BP: 160/90  Pulse: 115  Temp: 99.1 F (37.3 C)  Resp: 18   Filed Vitals:   01/05/14 1337  Height:  5\' 6"  (1.676 m)  Weight: 130 lb (58.968 kg)   Body mass index is 20.99 kg/(m^2). Ideal Body Weight: Weight in (lb) to have BMI = 25: 154.6  GEN: WDWN, NAD, Non-toxic, A & O x 3, slim build, looks well HEENT: Atraumatic, Normocephalic. Neck supple. No masses, small but tender cervical nodes.  Bilateral TM wnl, oropharynx normal.  PEERL,EOMI.   Ears and Nose: No external deformity. CV: RRR, No M/G/R. No JVD. No thrill. No extra heart sounds. PULM: CTA B, no wheezes, crackles, rhonchi. No retractions. No resp. distress. No accessory muscle use. ABD: S, NT, ND. No rebound. No HSM. EXTR: No c/c/e NEURO Normal gait.  PSYCH: Normally interactive. Conversant. Not depressed or anxious appearing. Admits that she feels very anxious and that her pulse tends to go up at MD office   Results for orders placed in visit on 01/05/14  POCT CBC      Result Value Range   WBC 11.4 (*) 4.6 - 10.2 K/uL   Lymph, poc 1.2  0.6 - 3.4   POC LYMPH  PERCENT 10.6  10 - 50 %L   MID (cbc) 0.8  0 - 0.9   POC MID % 7.0  0 - 12 %M   POC Granulocyte 9.4 (*) 2 - 6.9   Granulocyte percent 82.4 (*) 37 - 80 %G   RBC 5.14  4.04 - 5.48 M/uL   Hemoglobin 15.9  12.2 - 16.2 g/dL   HCT, POC 16.1 (*) 09.6 - 47.9 %   MCV 98.5 (*) 80 - 97 fL   MCH, POC 30.9  27 - 31.2 pg   MCHC 31.4 (*) 31.8 - 35.4 g/dL   RDW, POC 04.5     Platelet Count, POC 294  142 - 424 K/uL   MPV 7.8  0 - 99.8 fL  POCT INFLUENZA A/B      Result Value Range   Influenza A, POC Positive     Influenza B, POC Negative    POCT RAPID STREP A (OFFICE)      Result Value Range   Rapid Strep A Screen Negative  Negative    Assessment and Plan: Sinus pain - Plan: POCT CBC, POCT Influenza A/B, POCT rapid strep A  Cough - Plan: HYDROcodone-homatropine (HYCODAN) 5-1.5 MG/5ML syrup  Allergic conjunctivitis - Plan: Olopatadine HCl (PATADAY) 0.2 % SOLN  Influenza due to identified novel influenza A virus with other respiratory manifestations - Plan: oseltamivir (TAMIFLU) 75 MG capsule, ipratropium (ATROVENT) 0.03 % nasal spray  Gabrielle Norton does have the flu today.  Explained this is likely why she started to feel worse.  She will finish out her augmentin.  She would like to have an rx for tamiflu.  Understands she is probably out of the tamiflu window but as we are not sure exactly when she got sick she might like to try it anyway.  Hycodan as needed for cough May try atrovent NS for PND Discussed her tachycardia and HTN.  She endorses anxiety and feels this is shy her HR is up She will check her BP and HR at home and let me know if any abnormality persists.    Signed Abbe Amsterdam, MD

## 2014-01-08 ENCOUNTER — Encounter: Payer: Self-pay | Admitting: Family Medicine

## 2014-01-09 NOTE — Telephone Encounter (Signed)
Called and spoke with her.  Overall she is a bit better, does not think she is running a fever.  Explained that her current sx are likely due to flu, and that another abx so shortly after she finished her augmentin is likely not a good idea.  She will keep me posted on her condition, and will let me know if not improving in the next 1 or 2 days Her eye sx are now resolved

## 2014-04-04 ENCOUNTER — Encounter: Payer: Self-pay | Admitting: Gynecology

## 2014-05-07 ENCOUNTER — Encounter: Payer: Self-pay | Admitting: Obstetrics & Gynecology

## 2014-05-31 ENCOUNTER — Ambulatory Visit (INDEPENDENT_AMBULATORY_CARE_PROVIDER_SITE_OTHER): Payer: 59 | Admitting: Family Medicine

## 2014-05-31 VITALS — BP 150/88 | HR 135 | Temp 98.5°F | Resp 20 | Ht 65.75 in | Wt 136.0 lb

## 2014-05-31 DIAGNOSIS — F4320 Adjustment disorder, unspecified: Secondary | ICD-10-CM

## 2014-05-31 DIAGNOSIS — F410 Panic disorder [episodic paroxysmal anxiety] without agoraphobia: Secondary | ICD-10-CM

## 2014-05-31 DIAGNOSIS — J329 Chronic sinusitis, unspecified: Secondary | ICD-10-CM

## 2014-05-31 DIAGNOSIS — IMO0001 Reserved for inherently not codable concepts without codable children: Secondary | ICD-10-CM

## 2014-05-31 DIAGNOSIS — H1010 Acute atopic conjunctivitis, unspecified eye: Secondary | ICD-10-CM

## 2014-05-31 DIAGNOSIS — N951 Menopausal and female climacteric states: Secondary | ICD-10-CM

## 2014-05-31 DIAGNOSIS — H1045 Other chronic allergic conjunctivitis: Secondary | ICD-10-CM

## 2014-05-31 DIAGNOSIS — R030 Elevated blood-pressure reading, without diagnosis of hypertension: Secondary | ICD-10-CM

## 2014-05-31 LAB — POCT CBC
GRANULOCYTE PERCENT: 86.9 % — AB (ref 37–80)
HCT, POC: 46.7 % (ref 37.7–47.9)
HEMOGLOBIN: 14.9 g/dL (ref 12.2–16.2)
Lymph, poc: 1 (ref 0.6–3.4)
MCH: 30.6 pg (ref 27–31.2)
MCHC: 31.9 g/dL (ref 31.8–35.4)
MCV: 95.8 fL (ref 80–97)
MID (cbc): 0.4 (ref 0–0.9)
MPV: 7.9 fL (ref 0–99.8)
POC Granulocyte: 9.4 — AB (ref 2–6.9)
POC LYMPH PERCENT: 9.1 %L — AB (ref 10–50)
POC MID %: 4 % (ref 0–12)
Platelet Count, POC: 374 10*3/uL (ref 142–424)
RBC: 4.87 M/uL (ref 4.04–5.48)
RDW, POC: 13.6 %
WBC: 10.8 10*3/uL — AB (ref 4.6–10.2)

## 2014-05-31 MED ORDER — AMOXICILLIN-POT CLAVULANATE 875-125 MG PO TABS: 1.0000 | ORAL_TABLET | Freq: Two times a day (BID) | ORAL | Status: DC

## 2014-05-31 MED ORDER — OLOPATADINE HCL 0.2 % OP SOLN: OPHTHALMIC | Status: DC

## 2014-05-31 MED ORDER — HYDROXYZINE HCL 25 MG PO TABS: 12.5000 mg | ORAL_TABLET | Freq: Four times a day (QID) | ORAL | Status: DC | PRN

## 2014-05-31 MED ORDER — BACITRACIN-POLYMYXIN B 500-10000 UNIT/GM OP OINT: 1.0000 "application " | TOPICAL_OINTMENT | Freq: Four times a day (QID) | OPHTHALMIC | Status: DC

## 2014-05-31 MED ORDER — TRAZODONE HCL 50 MG PO TABS: 25.0000 mg | ORAL_TABLET | Freq: Every evening | ORAL | Status: DC | PRN

## 2014-05-31 MED ORDER — METHYLPREDNISOLONE 4 MG PO KIT: PACK | ORAL | Status: DC

## 2014-05-31 NOTE — Patient Instructions (Addendum)
[facility]  [address], [place], [place] [address]  [phone]  [facility]  Address: [address], [place], [place] [address]  [phone]  [facility] Address: [address], [place], [place] [address]  [phone]  [facility] [address] [place], [place] Phone: [phone]  Panic Attacks Panic attacks are sudden, short-livedsurges of severe anxiety, fear, or discomfort. They may occur for no reason when you are relaxed, when you are anxious, or when you are sleeping. Panic attacks may occur for a number of reasons:   Healthy people occasionally have panic attacks in extreme, life-threatening situations, such as war or natural disasters. Normal anxiety is a protective mechanism of the body that helps us react to danger (fight or flight response).  Panic attacks are often seen with anxiety disorders, such as panic disorder, social anxiety disorder, generalized anxiety disorder, and phobias. Anxiety disorders cause excessive or uncontrollable anxiety. They may interfere with your relationships or other life activities.  Panic attacks are sometimes seen with other mental illnesses such as depression and posttraumatic stress disorder.  Certain medical conditions, prescription medicines, and drugs of abuse can cause panic attacks. SYMPTOMS  Panic attacks start suddenly, peak within 20 minutes, and are accompanied by four or more of the following symptoms:  Pounding heart or fast heart rate (palpitations).  Sweating.  Trembling or shaking.  Shortness of breath or feeling smothered.  Feeling choked.  Chest pain or discomfort.  Nausea or strange feeling in your stomach.  Dizziness, lightheadedness, or feeling like you will faint.  Chills or hot flushes.  Numbness or tingling in your lips or hands and feet.  Feeling that things are  not real or feeling that you are not yourself.  Fear of losing control or going crazy.  Fear of dying. Some of these symptoms can mimic serious medical conditions. For example, you may think you are having a heart attack. Although panic attacks can be very scary, they are not life threatening. DIAGNOSIS  Panic attacks are diagnosed through an assessment by your health care provider. Your health care provider will ask questions about your symptoms, such as where and when they occurred. Your health care provider will also ask about your medical history and use of alcohol and drugs, including prescription medicines. Your health care provider may order blood tests or other studies to rule out a serious medical condition. Your health care provider may refer you to a mental health professional for further evaluation. TREATMENT   Most healthy people who have one or two panic attacks in an extreme, life-threatening situation will not require treatment.  The treatment for panic attacks associated with anxiety disorders or other mental illness typically involves counseling with a mental health professional, medicine, or a combination of both. Your health care provider will help determine what treatment is best for you.  Panic attacks due to physical illness usually goes away with treatment of the illness. If prescription medicine is causing panic attacks, talk with your health care provider about stopping the medicine, decreasing the dose, or substituting another medicine.  Panic attacks due to alcohol or drug abuse goes away with abstinence. Some adults need professional help in order to stop drinking or using drugs. HOME CARE INSTRUCTIONS   Take all your medicines as prescribed.   Check with your health care provider before starting new prescription or over-the-counter medicines.  Keep all follow up appointments with your health care provider.  SEEK MEDICAL CARE IF:  You are not able to take your  medicines as prescribed.  Your symptoms do not improve or get worse. SEEK IMMEDIATE MEDICAL CARE IF:   You experience panic attack symptoms that are different than your usual symptoms.  You have serious thoughts about hurting yourself or others.  You are taking medicine for panic attacks and have a serious side effect. MAKE SURE YOU:  Understand these instructions.  Will watch your condition.  Will get help right away if you are not doing well or get worse. Document Released: 12/07/2005 Document Revised: 09/27/2013 Document Reviewed: 07/21/2013 [facility] Patient Information 2014 [place], [place]. Perimenopause Perimenopause is the time when your body begins to move into the menopause (no menstrual period for 12 straight months). It is a natural process. Perimenopause can begin 2 8 years before the menopause and usually lasts for 1 year after the menopause. During this time, your ovaries may or may not produce an egg. The ovaries vary in their production of estrogen and progesterone hormones each month. This can cause irregular menstrual periods, difficulty getting pregnant, vaginal bleeding between periods, and uncomfortable symptoms. CAUSES  Irregular production of the ovarian hormones, estrogen and progesterone, and not ovulating every month.  Other causes include:  Tumor of the pituitary gland in the brain.  Medical disease that affects the ovaries.  Radiation treatment.  Chemotherapy.  Unknown causes.  Heavy smoking and excessive alcohol intake can bring on perimenopause sooner. SIGNS AND SYMPTOMS   Hot flashes.  Night sweats.  Irregular menstrual periods.  Decreased sex drive.  Vaginal dryness.  Headaches.  Mood swings.  Depression.  Memory problems.  Irritability.  Tiredness.  Weight gain.  Trouble getting pregnant.  The beginning of losing bone cells (osteoporosis).  The beginning of hardening of the arteries (atherosclerosis). DIAGNOSIS   Your health care provider will make a diagnosis by analyzing your age, menstrual history, and symptoms. He or she will do a physical exam and note any changes in your body, especially your female organs. Female hormone tests may or may not be helpful depending on the amount of female hormones you produce and when you produce them. However, other hormone tests may be helpful to rule out other problems. TREATMENT  In some cases, no treatment is needed. The decision on whether treatment is necessary during the perimenopause should be made by you and your health care provider based on how the symptoms are affecting you and your lifestyle. Various treatments are available, such as:  Treating individual symptoms with a specific medicine for that symptom.  Herbal medicines that can help specific symptoms.  Counseling.  Group therapy. HOME CARE INSTRUCTIONS   Keep track of your menstrual periods (when they occur, how heavy they are, how long between periods, and how long they last) as well as your symptoms and when they started.  Only take over-the-counter or prescription medicines as directed by your health care provider.  Sleep and rest.  Exercise.  Eat a diet that contains calcium (good for your bones) and soy (acts like the estrogen hormone).  Do not smoke.  Avoid alcoholic beverages.  Take vitamin supplements as recommended by your health care provider. Taking vitamin E may help in certain cases.  Take calcium and vitamin D supplements to help prevent bone loss.  Group therapy is sometimes helpful.  Acupuncture may help in some cases. SEEK MEDICAL CARE IF:   You have questions about any symptoms you are having.  You need a referral to  a specialist (gynecologist, psychiatrist, or psychologist). SEEK IMMEDIATE MEDICAL CARE IF:   You have vaginal bleeding.  Your period lasts longer than 8 days.  Your periods are recurring sooner than 21 days.  You have bleeding after  intercourse.  You have severe depression.  You have pain when you urinate.  You have severe headaches.  You have vision problems. Document Released: 01/14/2005 Document Revised: 09/27/2013 Document Reviewed: 07/06/2013 [facility] Patient Information 2014 [place], [place]. Menopause and Herbal Products Menopause is the normal time of life when menstrual periods stop completely. Menopause is complete when you have missed 12 consecutive menstrual periods. It usually occurs between the ages of 14 to 67, with an average age of 4. Very rarely does a woman develop menopause before 51 years old. At menopause, your ovaries stop producing the female hormones, estrogen and progesterone. This can cause undesirable symptoms and also affect your health. Sometimes the symptoms can occur 4 to 5 years before the menopause begins. There is no relationship between menopause and:  Oral contraceptives.  Number of children you had.  Race.  The age your menstrual periods started (menarche). Heavy smokers and very thin women may develop menopause earlier in life. Estrogen and progesterone hormone treatment is the usual method of treating menopausal symptoms. However, there are women who should not take hormone treatment. This is true of:   Women that have breast or uterine cancer.  Women who prefer not to take hormones because of certain side effects (abnormal uterine bleeding).  Women who are afraid that hormones may cause breast cancer.  Women who have a history of liver disease, heart disease, stroke, or blood clots. For these women, there are other medications that may help treat their menopausal symptoms. These medications are found in plants and botanical products. They can be found in the form of herbs, teas, oils, tinctures, and pills.  CAUSES:  The ovaries stop producing the female hormones estrogen and progesterone.  Other causes include:  Surgery to remove both ovaries.  The ovaries stop  functioning for no know reason.  Tumors of the pituitary gland in the brain.  Medical disease that affects the ovaries and hormone production.  Radiation treatment to the abdomen or pelvis.  Chemotherapy that affects the ovaries. PHYTOESTROGENS: Phytoestrogens occur naturally in plants and plant products. They act like estrogen in the body. Herbal medications are made from these plants and botanical steroids. There are 3 types of phytoestrogens:  Isoflavones (genistein and daidzein) are found in soy, garbanzo beans, miso and tofu foods.  Ligins are found in the shell of seeds. They are used to make oils like flaxseed oil. The bacteria in your intestine act on these foods to produce the estrogen-like hormones.  Coumestans are estrogen-like. Some of the foods they are found in include sunflower seeds and bean sprouts. CONDITIONS AND THEIR POSSIBLE HERBAL TREATMENT:  Hot flashes and night sweats.  Soy, black cohosh and evening primrose.  Irritability, insomnia, depression and memory problems.  Chasteberry, ginseng, and soy.  St. John's wort may be helpful for depression. However, there is a concern of it causing cataracts of the eye and may have bad effects on other medications. St. John's wort should not be taken for long time and without your caregiver's advice.  Loss of libido and vaginal and skin dryness.  Wild yam and soy.  Prevention of coronary heart disease and osteoporosis.  Soy and Isoflavones. Several studies have shown that some women benefit from herbal medications, but most of the studies  have not consistently shown that these supplements are much better than placebo. Other forms of treatment to help women with menopausal symptoms include a balanced diet, rest, exercise, vitamin and calcium (with vitamin D) supplements, acupuncture, and group therapy when necessary. THOSE WHO SHOULD NOT TAKE HERBAL MEDICATIONS INCLUDE:  Women who are planning on getting pregnant  unless told by your caregiver.  Women who are breastfeeding unless told by your caregiver.  Women who are taking other prescription medications unless told by your caregiver.  Infants, children, and elderly women unless told by your caregiver. Different herbal medications have different and unmeasured amounts of the herbal ingredients. There are no regulations, quality control, and standardization of the ingredients in herbal medications. Therefore, the amount of the ingredient in the medication may vary from one herb, pill, tea, oil or tincture to another. Many herbal medications can cause serious problems and can even have poisonous effects if taken too much or too long. If problems develop, the medication should be stopped and recorded by your caregiver. HOME CARE INSTRUCTIONS  Do not take or give children herbal medications without your caregiver's advice.  Let your caregiver know all the medications you are taking. This includes prescription, over-the-counter, eye drops, and creams.  Do not take herbal medications longer or more than recommended.  Tell your caregiver about any side effects from the medication. SEEK MEDICAL CARE IF:  You develop a fever of 102 F (38.9 C), or as directed by your caregiver.  You feel sick to your stomach (nauseous), vomit, or have diarrhea.  You develop a rash.  You develop abdominal pain.  You develop severe headaches.  You start to have vision problems.  You feel dizzy or faint.  You start to feel numbness in any part of your body.  You start shaking (have convulsions). Document Released: 05/25/2008 Document Revised: 11/23/2012 Document Reviewed: 12/23/2010 [facility] Patient Information 2014 [place], [place].

## 2014-05-31 NOTE — Progress Notes (Signed)
Subjective:    Patient ID: Gabrielle Norton, female    DOB: February 18, 1961, 53 y.o.   MRN: [id number] Chief Complaint  Patient presents with  . Sinus Problem    pt states "feeling sick for past 3 weeks"--color mucus  . Eye Pain    swollen since yesterday  . Sore Throat    HPI  Felt better when she was at the beach last week. Has not needed xopenex since last week On zyrtec for severe seasonal allergies, netti pot twice a day, cough last week but taht resolved.   Hot/cold started last night, lots of sinus pain, and now right eye is swelling and spreading to left eye. Was matted together last night.   Left ear pain, sore throat.  Nervous, not sleeping, panic attacks, hot flashes.  Past Medical History  Diagnosis Date  . Acid reflux disease with ulcer   . History of anemia   . Asthma    Current Outpatient Prescriptions on File Prior to Visit  Medication Sig Dispense Refill  . Cetirizine HCl (ZYRTEC PO) Take by mouth.      . levalbuterol (XOPENEX HFA) 45 MCG/ACT inhaler Inhale 1-2 puffs into the lungs every 4 (four) hours as needed for wheezing.       Current Facility-Administered Medications on File Prior to Visit  Medication Dose Route Frequency Provider Last Rate Last Dose  . levonorgestrel (MIRENA) 20 MCG/24HR IUD   Intrauterine Once Dara Lords, MD       Allergies  Allergen Reactions  . Erythromycin Other (See Comments)    Cramping   . Xylocaine [Lidocaine Hcl] Rash     Review of Systems  Constitutional: Positive for diaphoresis and fatigue. Negative for fever.  HENT: Positive for congestion, postnasal drip, rhinorrhea, sinus pressure, sneezing and sore throat. Negative for ear pain, facial swelling, tinnitus and trouble swallowing.   Eyes: Negative for photophobia and visual disturbance.  Respiratory: Positive for cough and wheezing.   Cardiovascular: Negative for chest pain and palpitations.  Endocrine: Positive for heat intolerance. Negative for cold  intolerance.  Psychiatric/Behavioral: Positive for sleep disturbance and agitation. Negative for suicidal ideas, self-injury and dysphoric mood. The patient is nervous/anxious.       BP 150/88  Pulse 135  Temp(Src) 98.5 F (36.9 C) (Oral)  Resp 20  Ht 5' 5.75" (1.67 m)  Wt 136 lb (61.689 kg)  BMI 22.12 kg/m2  SpO2 98%  LMP 05/24/2014 Objective:   Physical Exam  Constitutional: She is oriented to person, place, and time. She appears well-developed and well-nourished. She appears lethargic. She appears ill. No distress.  HENT:  Head: Normocephalic and atraumatic.  Right Ear: External ear and ear canal normal. Tympanic membrane is retracted. A middle ear effusion is present.  Left Ear: External ear and ear canal normal. Tympanic membrane is retracted. A middle ear effusion is present.  Nose: Mucosal edema and rhinorrhea present. Right sinus exhibits maxillary sinus tenderness. Left sinus exhibits maxillary sinus tenderness.  Mouth/Throat: Uvula is midline and mucous membranes are normal. Posterior oropharyngeal erythema present. No oropharyngeal exudate, posterior oropharyngeal edema or tonsillar abscesses.  Eyes: Conjunctivae are normal. Right eye exhibits no discharge. Left eye exhibits no discharge. No scleral icterus.  Neck: Normal range of motion. Neck supple.  Cardiovascular: Normal rate, regular rhythm, normal heart sounds and intact distal pulses.   Pulmonary/Chest: Effort normal and breath sounds normal.  Lymphadenopathy:       Head (right side): Submandibular adenopathy present. No preauricular and no  posterior auricular adenopathy present.       Head (left side): Submandibular adenopathy present. No preauricular and no posterior auricular adenopathy present.    She has no cervical adenopathy.       Right: No supraclavicular adenopathy present.       Left: No supraclavicular adenopathy present.  Neurological: She is oriented to person, place, and time. She appears lethargic.    Skin: Skin is warm and dry. She is not diaphoretic. No erythema.  Psychiatric: She has a normal mood and affect. Her behavior is normal.          Results for orders placed in visit on 05/31/14  POCT CBC      Result Value Ref Range   WBC 10.8 (*) 4.6 - 10.2 K/uL   Lymph, poc 1.0  0.6 - 3.4   POC LYMPH PERCENT 9.1 (*) 10 - 50 %L   MID (cbc) 0.4  0 - 0.9   POC MID % 4.0  0 - 12 %M   POC Granulocyte 9.4 (*) 2 - 6.9   Granulocyte percent 86.9 (*) 37 - 80 %G   RBC 4.87  4.04 - 5.48 M/uL   Hemoglobin 14.9  12.2 - 16.2 g/dL   HCT, POC 16.146.7  09.637.7 - 47.9 %   MCV 95.8  80 - 97 fL   MCH, POC 30.6  27 - 31.2 pg   MCHC 31.9  31.8 - 35.4 g/dL   RDW, POC 04.513.6     Platelet Count, POC 374  142 - 424 K/uL   MPV 7.9  0 - 99.8 fL     Assessment & Plan:   Elevated blood pressure - Plan: POCT CBC, Ferritin  Sinusitis - Plan: POCT CBC, Ferritin  Allergic conjunctivitis - Plan: Ferritin, Olopatadine HCl (PATADAY) 0.2 % SOLN  Perimenopausal symptoms - Plan: Ferritin  Panic anxiety syndrome - Plan: Ferritin  Adjustment disorder - Plan: Ferritin  Meds ordered this encounter  Medications  . Olopatadine HCl (PATADAY) 0.2 % SOLN    Sig: 1 drop in each eye daily    Dispense:  2.5 mL    Refill:  6  . hydrOXYzine (ATARAX/VISTARIL) 25 MG tablet    Sig: Take 0.5-1 tablets (12.5-25 mg total) by mouth every 6 (six) hours as needed for anxiety.    Dispense:  30 tablet    Refill:  1  . traZODone (DESYREL) 50 MG tablet    Sig: Take 0.5-1 tablets (25-50 mg total) by mouth at bedtime as needed for sleep.    Dispense:  30 tablet    Refill:  1  . bacitracin-polymyxin b (POLYSPORIN) ophthalmic ointment    Sig: Place 1 application into both eyes 4 (four) times daily.    Dispense:  3.5 g    Refill:  0  . amoxicillin-clavulanate (AUGMENTIN) 875-125 MG per tablet    Sig: Take 1 tablet by mouth 2 (two) times daily.    Dispense:  20 tablet    Refill:  0  . methylPREDNISolone (MEDROL, PAK,) 4 MG tablet     Sig: follow package directions    Dispense:  21 tablet    Refill:  0    Norberto SorensonEva Mihir Flanigan, MD MPH

## 2014-06-01 ENCOUNTER — Telehealth: Payer: Self-pay

## 2014-06-01 LAB — FERRITIN: Ferritin: 24 ng/mL (ref 10–291)

## 2014-06-01 MED ORDER — GENTAMICIN SULFATE 0.3 % OP SOLN: 2.0000 [drp] | OPHTHALMIC | Status: DC

## 2014-06-01 NOTE — Telephone Encounter (Signed)
New rx sent in

## 2014-06-01 NOTE — Telephone Encounter (Signed)
Pt called and reported that she does not like using the ointment for her eyes. It is making her eye mat together and she is afraid to use it in her good eye as Rxd, but stated that her good eye is beginning to have Sxs also and she knows she needs something for both eyes. Pt requests a new Rx be sent in for a liquid drop instead of the polysporin ointment. Dr Clelia CroftShaw, can you Rx a drop for her? She states she has used the ointment twice today and would like something that she can go ahead and use today in both eyes.

## 2014-06-02 MED ORDER — AZELASTINE HCL 0.05 % OP SOLN: 1.0000 [drp] | Freq: Two times a day (BID) | OPHTHALMIC | Status: DC

## 2014-06-02 NOTE — Telephone Encounter (Signed)
Unsure what other ointment pt was referring to - she did not like polysporin ointment so changed to gent drops.  Pataday to expensive - cannot get a PA until she has failed others that are on her insurance list but we have no way of knowing what her insurance covers - she will need to call her insurance for this or perhaps her pharmacist can help. There are lots over other options for allergy eye drops - for example Zaditor which is available otc, or patanol or optivar which are rx just to name a few. For now - try optivar instead of pataday. New rx sent to pharmacy.

## 2014-06-02 NOTE — Telephone Encounter (Signed)
Not using the allergy eye drops because of insurance purposes (pataday).   Is there anything we can do to aid in a prior authorization for this medication? She is aware of her new med being called in, she also did not p/u the other medication because it was an ointment. I instructed her not to pick that up as she has a new medication to p/u.

## 2014-06-02 NOTE — Addendum Note (Signed)
Addended by: Norberto SorensonSHAW, EVA on: 06/02/2014 10:02 AM   Modules accepted: Orders, Medications

## 2014-06-06 ENCOUNTER — Telehealth: Payer: Self-pay

## 2014-06-06 NOTE — Telephone Encounter (Signed)
Please review. Thanks!  

## 2014-06-06 NOTE — Telephone Encounter (Signed)
PT HAVE QUESTIONS REGARDING HER LAB RESULTS PLEASE CALL [phone]

## 2014-06-08 ENCOUNTER — Encounter: Payer: Self-pay | Admitting: Family Medicine

## 2014-06-29 ENCOUNTER — Other Ambulatory Visit: Payer: Self-pay | Admitting: Family Medicine

## 2014-10-17 ENCOUNTER — Ambulatory Visit (INDEPENDENT_AMBULATORY_CARE_PROVIDER_SITE_OTHER): Payer: 59 | Admitting: Women's Health

## 2014-10-17 ENCOUNTER — Ambulatory Visit: Payer: 59 | Admitting: Women's Health

## 2014-10-17 ENCOUNTER — Encounter: Payer: Self-pay | Admitting: Women's Health

## 2014-10-17 VITALS — BP 180/96 | Ht 65.0 in | Wt 129.0 lb

## 2014-10-17 DIAGNOSIS — N898 Other specified noninflammatory disorders of vagina: Secondary | ICD-10-CM

## 2014-10-17 DIAGNOSIS — D509 Iron deficiency anemia, unspecified: Secondary | ICD-10-CM

## 2014-10-17 DIAGNOSIS — F411 Generalized anxiety disorder: Secondary | ICD-10-CM

## 2014-10-17 LAB — WET PREP FOR TRICH, YEAST, CLUE
CLUE CELLS WET PREP: NONE SEEN
Trich, Wet Prep: NONE SEEN
Yeast Wet Prep HPF POC: NONE SEEN

## 2014-10-17 LAB — FERRITIN: Ferritin: 40 ng/mL (ref 10–291)

## 2014-10-17 LAB — TSH: TSH: 1.699 u[IU]/mL (ref 0.350–4.500)

## 2014-10-17 MED ORDER — ALPRAZOLAM 0.25 MG PO TABS: 0.2500 mg | ORAL_TABLET | Freq: Every evening | ORAL | Status: AC | PRN

## 2014-10-17 NOTE — Progress Notes (Signed)
Patient ID: Gabrielle BucyAudrienne M Norton, female   DOB: 02-07-1961, 53 y.o.   MRN: [id number] Presents with persistent spotting and anxiety. Has had spotting most days, denies heavy bleeding. Mirena IUD placed 05/2013. Smoker. Denies urinary symptoms, vaginal discharge, abdominal pain or fever. Reports feeling shaky, nervous, hot flushes, night sweats, and generally overwhelmed. Denies suicidal ideation.  Denies hypertension or elevated blood pressure history. Questions if hormonal imbalance causing anxiety. 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 Accompanied by husband. History of low ferritin. 3 children all doing well, youngest 6311. Husband dialysis, failed transplant.  Exam: Hyperventilating, appears very anxious. Blood pressure 176/90 with retake. Abdomen soft, nontender, external genitalia within normal limits, speculum exam scant menses type blood present, IUD strings visible. Wet prep negative. Bimanual no CMT or adnexal fullness or tenderness.  Panic attack/anxiety Spotting with Mirena IUD  Plan: Left message with Berniece AndreasJulie Whitt therapist to schedule counseling session ASAP, Xanax 0.25, 1/2 tablet initially, retake blood pressure at home and if continues elevated refer to primary care to manage. Reviewed need for counseling and probable daily medication to manage anxiety. States does not like to take any medication. Ferritin, TSH, FSH.

## 2014-10-17 NOTE — Patient Instructions (Signed)
Generalized Anxiety Disorder Generalized anxiety disorder (GAD) is a mental disorder. It interferes with life functions, including relationships, work, and school. GAD is different from normal anxiety, which everyone experiences at some point in their lives in response to specific life events and activities. Normal anxiety actually helps us prepare for and get through these life events and activities. Normal anxiety goes away after the event or activity is over.  GAD causes anxiety that is not necessarily related to specific events or activities. It also causes excess anxiety in proportion to specific events or activities. The anxiety associated with GAD is also difficult to control. GAD can vary from mild to severe. People with severe GAD can have intense waves of anxiety with physical symptoms (panic attacks).  SYMPTOMS The anxiety and worry associated with GAD are difficult to control. This anxiety and worry are related to many life events and activities and also occur more days than not for 6 months or longer. People with GAD also have three or more of the following symptoms (one or more in children):  Restlessness.   Fatigue.  Difficulty concentrating.   Irritability.  Muscle tension.  Difficulty sleeping or unsatisfying sleep. DIAGNOSIS GAD is diagnosed through an assessment by your health care provider. Your health care provider will ask you questions aboutyour mood,physical symptoms, and events in your life. Your health care provider may ask you about your medical history and use of alcohol or drugs, including prescription medicines. Your health care provider may also do a physical exam and blood tests. Certain medical conditions and the use of certain substances can cause symptoms similar to those associated with GAD. Your health care provider may refer you to a mental health specialist for further evaluation. TREATMENT The following therapies are usually used to treat GAD:    Medication. Antidepressant medication usually is prescribed for long-term daily control. Antianxiety medicines may be added in severe cases, especially when panic attacks occur.   Talk therapy (psychotherapy). Certain types of talk therapy can be helpful in treating GAD by providing support, education, and guidance. A form of talk therapy called cognitive behavioral therapy can teach you healthy ways to think about and react to daily life events and activities.  Stress managementtechniques. These include yoga, meditation, and exercise and can be very helpful when they are practiced regularly. A mental health specialist can help determine which treatment is best for you. Some people see improvement with one therapy. However, other people require a combination of therapies. Document Released: 04/03/2013 Document Revised: 04/23/2014 Document Reviewed: 04/03/2013 [employer] Patient Information 2015 [employer]. This information is not intended to replace advice given to you by your health care provider. Make sure you discuss any questions you have with your health care provider.  

## 2014-10-18 ENCOUNTER — Ambulatory Visit: Payer: 59 | Admitting: Licensed Clinical Social Worker

## 2014-10-18 ENCOUNTER — Ambulatory Visit: Payer: 59 | Admitting: Women's Health

## 2014-10-18 ENCOUNTER — Other Ambulatory Visit: Payer: Self-pay | Admitting: Women's Health

## 2014-10-18 DIAGNOSIS — N938 Other specified abnormal uterine and vaginal bleeding: Secondary | ICD-10-CM

## 2014-10-18 LAB — FOLLICLE STIMULATING HORMONE: FSH: 26.3 m[IU]/mL

## 2014-10-18 MED ORDER — MEGESTROL ACETATE 40 MG PO TABS: 40.0000 mg | ORAL_TABLET | Freq: Two times a day (BID) | ORAL | Status: DC

## 2014-10-22 ENCOUNTER — Encounter: Payer: Self-pay | Admitting: Women's Health

## 2014-10-23 ENCOUNTER — Telehealth: Payer: Self-pay | Admitting: Women's Health

## 2014-10-23 NOTE — Telephone Encounter (Signed)
Telephone call for follow-up, states has scheduled appointment with counselor on November 11, doing better, able to sit through classes and sleeping better. Bleeding did stop.

## 2014-10-26 ENCOUNTER — Ambulatory Visit (INDEPENDENT_AMBULATORY_CARE_PROVIDER_SITE_OTHER): Payer: 59 | Admitting: Licensed Clinical Social Worker

## 2014-10-26 DIAGNOSIS — F410 Panic disorder [episodic paroxysmal anxiety] without agoraphobia: Secondary | ICD-10-CM

## 2014-10-31 ENCOUNTER — Ambulatory Visit (INDEPENDENT_AMBULATORY_CARE_PROVIDER_SITE_OTHER): Payer: 59 | Admitting: Licensed Clinical Social Worker

## 2014-10-31 DIAGNOSIS — F411 Generalized anxiety disorder: Secondary | ICD-10-CM

## 2014-11-21 ENCOUNTER — Ambulatory Visit: Payer: 59 | Admitting: Licensed Clinical Social Worker

## 2014-12-15 ENCOUNTER — Other Ambulatory Visit: Payer: Self-pay | Admitting: Physician Assistant

## 2014-12-25 ENCOUNTER — Ambulatory Visit (INDEPENDENT_AMBULATORY_CARE_PROVIDER_SITE_OTHER): Payer: 59 | Admitting: Gynecology

## 2014-12-25 ENCOUNTER — Encounter: Payer: Self-pay | Admitting: Gynecology

## 2014-12-25 ENCOUNTER — Other Ambulatory Visit (HOSPITAL_COMMUNITY)
Admission: RE | Admit: 2014-12-25 | Discharge: 2014-12-25 | Disposition: A | Discharge status: Home / Self Care | Payer: 59 | Source: Ambulatory Visit | Attending: Gynecology | Admitting: Gynecology

## 2014-12-25 VITALS — BP 160/80 | Ht 66.0 in | Wt 137.0 lb

## 2014-12-25 DIAGNOSIS — Z30432 Encounter for removal of intrauterine contraceptive device: Secondary | ICD-10-CM

## 2014-12-25 DIAGNOSIS — Z01419 Encounter for gynecological examination (general) (routine) without abnormal findings: Secondary | ICD-10-CM | POA: Diagnosis not present

## 2014-12-25 DIAGNOSIS — Z1151 Encounter for screening for human papillomavirus (HPV): Secondary | ICD-10-CM | POA: Insufficient documentation

## 2014-12-25 DIAGNOSIS — N926 Irregular menstruation, unspecified: Secondary | ICD-10-CM

## 2014-12-25 HISTORY — PX: IUD REMOVAL: SHX5392

## 2014-12-25 NOTE — Progress Notes (Signed)
Gabrielle Norton 1961-10-14 [id number]        54 y.o.  [id number] for annual exam.  Several issues noted below.  Past medical history,surgical history, problem list, medications, allergies, family history and social history were all reviewed and documented as reviewed in the EPIC chart.  ROS:  Performed with pertinent positives and negatives included in the history, assessment and plan.   Additional significant findings :  none   Exam: [facility] personassistant Filed Vitals:   12/25/14 1408  BP: 160/80  Height: 5\' 6"  (1.676 m)  Weight: 137 lb (62.143 kg)   General appearance:  Normal affect, orientation and appearance. Skin: Grossly normal HEENT: Without gross lesions.  No cervical or supraclavicular adenopathy. Thyroid normal.  Lungs:  Clear without wheezing, rales or rhonchi Cardiac: RR, without RMG Abdominal:  Soft, nontender, without masses, guarding, rebound, organomegaly or hernia Breasts:  Examined lying and sitting without masses, retractions, discharge or axillary adenopathy. Pelvic:  Ext/BUS/vagina normal  Cervix normal, IUD string visualized  Uterus anteverted, normal size, shape and contour, midline and mobile nontender   Adnexa  Without masses or tenderness    Anus and perineum  Normal   Rectovaginal  Normal sphincter tone without palpated masses or tenderness.   Procedure: IUD string was visualized with a speculum, [facility] and her Mirena IUD was removed, shown to her and her husband and discarded. Patient tolerated well.   Assessment/Plan:  54 y.o. [id number] female for annual exam without regular menses, Mirena IUD.   1. Irregular menses/Mirena IUD 05/2013/menopausal symptoms.  History menorrhagia 2014 with negative sonohysterogram and biopsy.  Had Mirena IUD placed to try to address both. Has not had menses since but has been spotting on and off almost on a daily basis.  Most recently has been developing anxiety attacks, hot flashes, night sweats and foggy  thinking. Was started [place] with some relief of the anxiety and has been seeing a counselor/psychiatrist for this. They offered antidepressant medication but she declined.  Recent blood work by Harri[facility] Sine[facility] showed a normal TSH and and FSH 26.  I reviewed with the patient and her husband that the symptoms certainly can be perimenopausal he related to include the anxiety hot flashes night sweats and foggy thinking. Options for management reviewed to include HRT discussed. I reviewed in detail the risks/benefits to include the WHI study with increased risk of stroke heart attack DVT and breast cancer. The ACOG and NAMS statements for lowest dose for shortest period of time.  Advantage of transdermal estrogen from a thrombosis risk also reviewed. She does smoke and would be considered a slightly higher risk. Patient states she does not want do anything that has risks. I reviewed with her that any option is going to have some risks and this has to be weighed against the benefits and quality-of-life issues. I reviewed her irregular bleeding and her IUD I recommended that she remove her IUD now given her perimenopausal status and constant spotting on and off. Keep a menstrual calendar after removal and then readdress any issues. The patient was already thinking about this and desires to have her IUD removed. The need for contraception now until the picture is clear discussed with her and her husband  And they understand the need to use backup contraception. The IUD was removed as above. Patient will see how she does over the next several weeks and then follow up with me for further discussion about HRT. She'll follow up sooner if  she has significant symptoms and wants to proceed with replacement sooner.  We'll check prolactin for completeness due to the irregular bleeding. TSH FSH recently done. 2. Blood pressure 160/80.  Was elevated when she saw [place]. She does take her blood pressure at home and reports that it is  normal in the 110/60 range. I discussed the issues of untreated hypertension and recommended that she continue to monitor this at home if it remains elevated she knows to call for referral to internal medicine. If it normalizes then we'll recheck it when she comes in next month. 3. Pap smear 2013. Pap/HPV today. No history of significant abnormal Pap smears previously. 4. Mammography never. I strongly recommended screening mammography. Benefits of early detection and treatment discussed. Patient acknowledges my recommendation and agrees to schedule. SBE monthly reviewed. 5. Colonoscopy never. I recommended patient schedule a screening colonoscopy as she is over the age of 37. Patient acknowledges my recommendation. 6. Health maintenance.  Baseline CBC comprehensive metabolic panel lipid profile urinalysis ordered along with the prolactin.     Dara Lords MD, 4:13 PM 12/25/2014

## 2014-12-25 NOTE — Patient Instructions (Addendum)
Follow up in one month for reassessment. Sooner if significant symptoms.  Monitor blood pressure daily. Report if it remains elevated.  Consider hormone replacement therapy as we discussed.  Schedule colonoscopy with [facility] gastroenterology at [phone] or [facility] gastroenterology at [phone]  Call to Schedule your mammogram  Facilities in [place]: 1)  [facility], [address] Phone: [phone] 2)  [facility]. [address] [place] Phone: [phone] 3)  Dr. Isaiah Blakes at [facility] [address] Phone: [phone]     Mammogram A mammogram is an X-ray test to find changes in a woman's breast. You should get a mammogram if:  You are 46 years of age or older  You have risk factors.   Your doctor recommends that you have one.  BEFORE THE TEST  Do not schedule the test the week before your period, especially if your breasts are sore during this time.  On the day of your mammogram:  Wash your breasts and armpits well. After washing, do not put on any deodorant or talcum powder on until after your test.   Eat and drink as you usually do.   Take your medicines as usual.   If you are diabetic and take insulin, make sure you:   Eat before coming for your test.   Take your insulin as usual.   If you cannot keep your appointment, call before the appointment to cancel. Schedule another appointment.  TEST  You will need to undress from the waist up. You will put on a hospital gown.   Your breast will be put on the mammogram machine, and it will press firmly on your breast with a piece of plastic called a compression paddle. This will make your breast flatter so that the machine can X-ray all parts of your breast.   Both breasts will be X-rayed. Each breast will be X-rayed from above and from the side. An X-ray might need to be taken again if the picture is not good  enough.   The mammogram will last about 15 to 30 minutes.  AFTER THE TEST Finding out the results of your test Ask when your test results will be ready. Make sure you get your test results.  Document Released: 03/05/2009 Document Revised: 11/26/2011 Document Reviewed: 03/05/2009 [facility] Patient Information 2012 [place].      You may obtain a copy of any labs that were done today by logging onto MyChart as outlined in the instructions provided with your AVS (after visit summary). The office will not call with normal lab results but certainly if there are any significant abnormalities then we will contact you.   Health Maintenance, Female A healthy lifestyle and preventative care can promote health and wellness.  Maintain regular health, dental, and eye exams.  Eat a healthy diet. Foods like vegetables, fruits, whole grains, low-fat dairy products, and lean protein foods contain the nutrients you need without too many calories. Decrease your intake of foods high in solid fats, added sugars, and salt. Get information about a proper diet from your caregiver, if necessary.  Regular physical exercise is one of the most important things you can do for your health. Most adults should get at least 150 minutes of moderate-intensity exercise (any activity that increases your heart rate and causes you to sweat) each week. In addition, most adults need muscle-strengthening exercises on 2 or more days a week.   Maintain a  healthy weight. The body mass index (BMI) is a screening tool to identify possible weight problems. It provides an estimate of body fat based on height and weight. Your caregiver can help determine your BMI, and can help you achieve or maintain a healthy weight. For adults 20 years and older:  A BMI below 18.5 is considered underweight.  A BMI of 18.5 to 24.9 is normal.  A BMI of 25 to 29.9 is considered overweight.  A BMI of 30 and above is considered  obese.  Maintain normal blood lipids and cholesterol by exercising and minimizing your intake of saturated fat. Eat a balanced diet with plenty of fruits and vegetables. Blood tests for lipids and cholesterol should begin at age 41 and be repeated every 5 years. If your lipid or cholesterol levels are high, you are over 50, or you are a high risk for heart disease, you may need your cholesterol levels checked more frequently.Ongoing high lipid and cholesterol levels should be treated with medicines if diet and exercise are not effective.  If you smoke, find out from your caregiver how to quit. If you do not use tobacco, do not start.  Lung cancer screening is recommended for adults aged 70 80 years who are at high risk for developing lung cancer because of a history of smoking. Yearly low-dose computed tomography (CT) is recommended for people who have at least a 30-pack-year history of smoking and are a current smoker or have quit within the past 15 years. A pack year of smoking is smoking an average of 1 pack of cigarettes a day for 1 year (for example: 1 pack a day for 30 years or 2 packs a day for 15 years). Yearly screening should continue until the smoker has stopped smoking for at least 15 years. Yearly screening should also be stopped for people who develop a health problem that would prevent them from having lung cancer treatment.  If you are pregnant, do not drink alcohol. If you are breastfeeding, be very cautious about drinking alcohol. If you are not pregnant and choose to drink alcohol, do not exceed 1 drink per day. One drink is considered to be 12 ounces (355 mL) of beer, 5 ounces (148 mL) of wine, or 1.5 ounces (44 mL) of liquor.  Avoid use of street drugs. Do not share needles with anyone. Ask for help if you need support or instructions about stopping the use of drugs.  High blood pressure causes heart disease and increases the risk of stroke. Blood pressure should be checked at least  every 1 to 2 years. Ongoing high blood pressure should be treated with medicines, if weight loss and exercise are not effective.  If you are 31 to 52 years old, ask your caregiver if you should take aspirin to prevent strokes.  Diabetes screening involves taking a blood sample to check your fasting blood sugar level. This should be done once every 3 years, after age 23, if you are within normal weight and without risk factors for diabetes. Testing should be considered at a younger age or be carried out more frequently if you are overweight and have at least 1 risk factor for diabetes.  Breast cancer screening is essential preventative care for women. You should practice "breast self-awareness." This means understanding the normal appearance and feel of your breasts and may include breast self-examination. Any changes detected, no matter how small, should be reported to a caregiver. Women in their 65s and 30s should have a  clinical breast exam (CBE) by a caregiver as part of a regular health exam every 1 to 3 years. After age 34, women should have a CBE every year. Starting at age 63, women should consider having a mammogram (breast X-ray) every year. Women who have a family history of breast cancer should talk to their caregiver about genetic screening. Women at a high risk of breast cancer should talk to their caregiver about having an MRI and a mammogram every year.  Breast cancer gene (BRCA)-related cancer risk assessment is recommended for women who have family members with BRCA-related cancers. BRCA-related cancers include breast, ovarian, tubal, and peritoneal cancers. Having family members with these cancers may be associated with an increased risk for harmful changes (mutations) in the breast cancer genes BRCA1 and BRCA2. Results of the assessment will determine the need for genetic counseling and BRCA1 and BRCA2 testing.  The Pap test is a screening test for cervical cancer. Women should have a  Pap test starting at age 85. Between ages 78 and 1, Pap tests should be repeated every 2 years. Beginning at age 42, you should have a Pap test every 3 years as long as the past 3 Pap tests have been normal. If you had a hysterectomy for a problem that was not cancer or a condition that could lead to cancer, then you no longer need Pap tests. If you are between ages 48 and 71, and you have had normal Pap tests going back 10 years, you no longer need Pap tests. If you have had past treatment for cervical cancer or a condition that could lead to cancer, you need Pap tests and screening for cancer for at least 20 years after your treatment. If Pap tests have been discontinued, risk factors (such as a new sexual partner) need to be reassessed to determine if screening should be resumed. Some women have medical problems that increase the chance of getting cervical cancer. In these cases, your caregiver may recommend more frequent screening and Pap tests.  The human papillomavirus (HPV) test is an additional test that may be used for cervical cancer screening. The HPV test looks for the virus that can cause the cell changes on the cervix. The cells collected during the Pap test can be tested for HPV. The HPV test could be used to screen women aged 57 years and older, and should be used in women of any age who have unclear Pap test results. After the age of 54, women should have HPV testing at the same frequency as a Pap test.  Colorectal cancer can be detected and often prevented. Most routine colorectal cancer screening begins at the age of 7 and continues through age 37. However, your caregiver may recommend screening at an earlier age if you have risk factors for colon cancer. On a yearly basis, your caregiver may provide home test kits to check for hidden blood in the stool. Use of a small camera at the end of a tube, to directly examine the colon (sigmoidoscopy or colonoscopy), can detect the earliest forms of  colorectal cancer. Talk to your caregiver about this at age 75, when routine screening begins. Direct examination of the colon should be repeated every 5 to 10 years through age 106, unless early forms of pre-cancerous polyps or small growths are found.  Hepatitis C blood testing is recommended for all people born from [birth year] through 1963 and any individual with known risks for hepatitis C.  Practice safe sex. Use condoms and  avoid high-risk sexual practices to reduce the spread of sexually transmitted infections (STIs). Sexually active women aged 16 and younger should be checked for Chlamydia, which is a common sexually transmitted infection. Older women with new or multiple partners should also be tested for Chlamydia. Testing for other STIs is recommended if you are sexually active and at increased risk.  Osteoporosis is a disease in which the bones lose minerals and strength with aging. This can result in serious bone fractures. The risk of osteoporosis can be identified using a bone density scan. Women ages 8 and over and women at risk for fractures or osteoporosis should discuss screening with their caregivers. Ask your caregiver whether you should be taking a calcium supplement or vitamin D to reduce the rate of osteoporosis.  Menopause can be associated with physical symptoms and risks. Hormone replacement therapy is available to decrease symptoms and risks. You should talk to your caregiver about whether hormone replacement therapy is right for you.  Use sunscreen. Apply sunscreen liberally and repeatedly throughout the day. You should seek shade when your shadow is shorter than you. Protect yourself by wearing long sleeves, pants, a wide-brimmed hat, and sunglasses year round, whenever you are outdoors.  Notify your caregiver of new moles or changes in moles, especially if there is a change in shape or color. Also notify your caregiver if a mole is larger than the size of a pencil eraser.  Stay  current with your immunizations. Document Released: 06/22/2011 Document Revised: 04/03/2013 Document Reviewed: 06/22/2011 [facility] Patient Information 2014 [place].

## 2014-12-26 LAB — CBC WITH DIFFERENTIAL/PLATELET
BASOS ABS: 0 10*3/uL (ref 0.0–0.1)
Basophils Relative: 0 % (ref 0–1)
EOS PCT: 1 % (ref 0–5)
Eosinophils Absolute: 0.1 10*3/uL (ref 0.0–0.7)
HCT: 45.2 % (ref 36.0–46.0)
Hemoglobin: 15.9 g/dL — ABNORMAL HIGH (ref 12.0–15.0)
Lymphocytes Relative: 14 % (ref 12–46)
Lymphs Abs: 1.4 10*3/uL (ref 0.7–4.0)
MCH: 32 pg (ref 26.0–34.0)
MCHC: 35.2 g/dL (ref 30.0–36.0)
MCV: 90.9 fL (ref 78.0–100.0)
MONOS PCT: 6 % (ref 3–12)
MPV: 8.9 fL (ref 8.6–12.4)
Monocytes Absolute: 0.6 10*3/uL (ref 0.1–1.0)
Neutro Abs: 8.1 10*3/uL — ABNORMAL HIGH (ref 1.7–7.7)
Neutrophils Relative %: 79 % — ABNORMAL HIGH (ref 43–77)
Platelets: 365 10*3/uL (ref 150–400)
RBC: 4.97 MIL/uL (ref 3.87–5.11)
RDW: 12.5 % (ref 11.5–15.5)
WBC: 10.2 10*3/uL (ref 4.0–10.5)

## 2014-12-26 LAB — URINALYSIS W MICROSCOPIC + REFLEX CULTURE
Bacteria, UA: NONE SEEN
Bilirubin Urine: NEGATIVE
Casts: NONE SEEN
Crystals: NONE SEEN
Glucose, UA: NEGATIVE mg/dL
HGB URINE DIPSTICK: NEGATIVE
Ketones, ur: NEGATIVE mg/dL
LEUKOCYTES UA: NEGATIVE
Nitrite: NEGATIVE
Protein, ur: NEGATIVE mg/dL
SQUAMOUS EPITHELIAL / LPF: NONE SEEN
Specific Gravity, Urine: <1.005 — ABNORMAL LOW (ref 1.005–1.030)
Urobilinogen, UA: 0.2 mg/dL (ref 0.0–1.0)
pH: 7 (ref 5.0–8.0)

## 2014-12-26 LAB — COMPREHENSIVE METABOLIC PANEL
ALT: 22 U/L (ref 0–35)
AST: 23 U/L (ref 0–37)
Albumin: 5 g/dL (ref 3.5–5.2)
Alkaline Phosphatase: 48 U/L (ref 39–117)
BILIRUBIN TOTAL: 0.4 mg/dL (ref 0.2–1.2)
BUN: 10 mg/dL (ref 6–23)
CHLORIDE: 103 meq/L (ref 96–112)
CO2: 25 mEq/L (ref 19–32)
CREATININE: 0.65 mg/dL (ref 0.50–1.10)
Calcium: 9.8 mg/dL (ref 8.4–10.5)
Glucose, Bld: 104 mg/dL — ABNORMAL HIGH (ref 70–99)
Potassium: 3.9 mEq/L (ref 3.5–5.3)
Sodium: 138 mEq/L (ref 135–145)
Total Protein: 7.7 g/dL (ref 6.0–8.3)

## 2014-12-26 LAB — LIPID PANEL
Cholesterol: 175 mg/dL (ref 0–200)
HDL: 64 mg/dL (ref >39)
LDL CALC: 100 mg/dL — AB (ref 0–99)
TRIGLYCERIDES: 54 mg/dL (ref <150)
Total CHOL/HDL Ratio: 2.7 Ratio
VLDL: 11 mg/dL (ref 0–40)

## 2014-12-26 LAB — PROLACTIN: Prolactin: 4.8 ng/mL

## 2014-12-27 LAB — CYTOLOGY - PAP

## 2014-12-31 ENCOUNTER — Telehealth: Payer: Self-pay

## 2014-12-31 NOTE — Telephone Encounter (Signed)
Left detailed message on husband voice mail. [phone]

## 2014-12-31 NOTE — Telephone Encounter (Signed)
Tell patient/husband labs essentially are normal. Minimally elevated glucose at 104 and LDL borderline at 100. Her prolactin level was normal

## 2014-12-31 NOTE — Telephone Encounter (Signed)
Husband called stating patient is anxious to find out results from her labs at [facility] 12/25/14.  What to tell him?  (He said, Patient can be reached at [phone].)

## 2015-01-10 ENCOUNTER — Telehealth: Payer: Self-pay | Admitting: *Deleted

## 2015-01-10 NOTE — Telephone Encounter (Signed)
Pt husband Gabrielle Norton who has DPR access informed pap smear results normal

## 2015-01-25 ENCOUNTER — Ambulatory Visit: Payer: 59 | Admitting: Gynecology

## 2018-06-18 ENCOUNTER — Ambulatory Visit: Payer: Self-pay | Admitting: Family Medicine

## 2018-06-18 VITALS — BP 168/100 | HR 125 | Temp 98.8°F | Resp 19 | Wt 143.0 lb

## 2018-06-18 DIAGNOSIS — R030 Elevated blood-pressure reading, without diagnosis of hypertension: Secondary | ICD-10-CM

## 2018-06-18 DIAGNOSIS — R009 Unspecified abnormalities of heart beat: Secondary | ICD-10-CM

## 2018-06-18 DIAGNOSIS — R21 Rash and other nonspecific skin eruption: Secondary | ICD-10-CM

## 2018-06-18 MED ORDER — VALACYCLOVIR HCL 1 G PO TABS: 1000.0000 mg | ORAL_TABLET | Freq: Three times a day (TID) | ORAL | 0 refills | Status: AC

## 2018-06-18 NOTE — Progress Notes (Signed)
Gabrielle Norton is a 57 y.o. female who presents today with concerns of a rash that she reports has been present for 3 days. The rash is limited to her right hip. She denies any known contacts with a similar issue. She reports that it is tender to touch a 6/10 on a pain scale.   Review of Systems  Constitutional: Negative for chills, fever and malaise/fatigue.  HENT: Negative for congestion, ear discharge, ear pain, sinus pain and sore throat.   Eyes: Negative.   Respiratory: Negative for cough, sputum production and shortness of breath.   Cardiovascular: Negative.  Negative for chest pain.  Gastrointestinal: Negative for abdominal pain, diarrhea, nausea and vomiting.  Genitourinary: Negative for dysuria, frequency, hematuria and urgency.  Musculoskeletal: Negative for myalgias.  Skin: Negative.   Neurological: Negative for headaches.  Endo/Heme/Allergies: Negative.   Psychiatric/Behavioral: Negative.     O: Vitals:   06/18/18 1420  BP: (!) 168/100  Pulse: (!) 125  Resp: 19  Temp: 98.8 F (37.1 C)  SpO2: 99%     Physical Exam  Constitutional: She is oriented to person, place, and time. Vital signs are normal. She appears well-developed and well-nourished. She is active.  Non-toxic appearance. She does not have a sickly appearance.  HENT:  Head: Normocephalic.  Right Ear: Hearing, tympanic membrane, external ear and ear canal normal.  Left Ear: Hearing, tympanic membrane, external ear and ear canal normal.  Nose: Nose normal.  Mouth/Throat: Uvula is midline and oropharynx is clear and moist.  Neck: Normal range of motion. Neck supple.  Cardiovascular: Normal rate, regular rhythm, normal heart sounds and normal pulses.  Pulmonary/Chest: Effort normal and breath sounds normal.  Abdominal: Soft. Bowel sounds are normal.  Musculoskeletal: Normal range of motion.  Lymphadenopathy:       Head (right side): No submental and no submandibular adenopathy present.       Head (left  side): No submental and no submandibular adenopathy present.    She has no cervical adenopathy.  Neurological: She is alert and oriented to person, place, and time.  Skin: Rash noted. Rash is macular. There is erythema.     Erythemic 2 cm x 6 cm estimated macular area- not raised flush with skin no evidence of vesicular lesions or ulcerations non fluctuant to touch - skin is intact  Psychiatric: She has a normal mood and affect.  Vitals reviewed.  A: 1. Rash and nonspecific skin eruption    P: Exam findings, diagnosis etiology and medication use and indications reviewed with patient. Follow- Up and discharge instructions provided. No emergent/urgent issues found on exam.  Patient verbalized understanding of information provided and agrees with plan of care (POC), all questions answered.  Patient requested treatment for shingles- will trial and f/u in person in 48-72 hours  Elevated blood pressure and pulse on exam- patient reports she has white coat syndrome-provider concern that both elevations have a more physiologic cause advised follow up with PCP immediately.   Differential includes- contact dermatitis and bug bites  1. Rash and nonspecific skin eruption - valACYclovir (VALTREX) 1000 MG tablet; Take 1 tablet (1,000 mg total) by mouth 3 (three) times daily for 7 days.  2. Elevated heart rate with elevated blood pressure without diagnosis of hypertension Directed to PCP for eval and treat

## 2018-06-18 NOTE — Patient Instructions (Signed)
